# Patient Record
Sex: Female | Born: 1955
Health system: Southern US, Community
[De-identification: ages and names within clinical notes are randomized; demographics above are authoritative.]

## PROBLEM LIST (undated history)

## (undated) DIAGNOSIS — E039 Hypothyroidism, unspecified: Secondary | ICD-10-CM

## (undated) DIAGNOSIS — F32A Depression, unspecified: Secondary | ICD-10-CM

## (undated) DIAGNOSIS — M1990 Unspecified osteoarthritis, unspecified site: Secondary | ICD-10-CM

## (undated) DIAGNOSIS — R112 Nausea with vomiting, unspecified: Secondary | ICD-10-CM

## (undated) DIAGNOSIS — F329 Major depressive disorder, single episode, unspecified: Secondary | ICD-10-CM

## (undated) DIAGNOSIS — Z9889 Other specified postprocedural states: Secondary | ICD-10-CM

## (undated) DIAGNOSIS — T4145XA Adverse effect of unspecified anesthetic, initial encounter: Secondary | ICD-10-CM

## (undated) DIAGNOSIS — T8859XA Other complications of anesthesia, initial encounter: Secondary | ICD-10-CM

## (undated) DIAGNOSIS — H8102 Meniere's disease, left ear: Secondary | ICD-10-CM

## (undated) HISTORY — PX: TONSILLECTOMY: SUR1361

## (undated) HISTORY — PX: ABDOMINAL HYSTERECTOMY: SHX81

## (undated) HISTORY — PX: LASER ABLATION: SHX1947

## (undated) HISTORY — PX: OTHER SURGICAL HISTORY: SHX169

---

## 1997-10-01 ENCOUNTER — Ambulatory Visit (HOSPITAL_COMMUNITY): Admission: RE | Admit: 1997-10-01 | Discharge: 1997-10-01 | Payer: Self-pay | Admitting: Gynecology

## 1998-10-28 ENCOUNTER — Ambulatory Visit (HOSPITAL_COMMUNITY): Admission: RE | Admit: 1998-10-28 | Discharge: 1998-10-28 | Payer: Self-pay | Admitting: Gynecology

## 1999-11-03 ENCOUNTER — Encounter: Payer: Self-pay | Admitting: Gynecology

## 1999-11-03 ENCOUNTER — Ambulatory Visit (HOSPITAL_COMMUNITY): Admission: RE | Admit: 1999-11-03 | Discharge: 1999-11-03 | Payer: Self-pay | Admitting: Gynecology

## 2000-04-24 ENCOUNTER — Other Ambulatory Visit: Admission: RE | Admit: 2000-04-24 | Discharge: 2000-04-24 | Payer: Self-pay | Admitting: Gynecology

## 2000-04-24 ENCOUNTER — Encounter (INDEPENDENT_AMBULATORY_CARE_PROVIDER_SITE_OTHER): Payer: Self-pay | Admitting: Specialist

## 2001-03-20 ENCOUNTER — Encounter: Payer: Self-pay | Admitting: Gynecology

## 2001-03-20 ENCOUNTER — Ambulatory Visit (HOSPITAL_COMMUNITY): Admission: RE | Admit: 2001-03-20 | Discharge: 2001-03-20 | Payer: Self-pay | Admitting: Gynecology

## 2001-06-11 ENCOUNTER — Other Ambulatory Visit: Admission: RE | Admit: 2001-06-11 | Discharge: 2001-06-11 | Payer: Self-pay | Admitting: Gynecology

## 2002-04-16 ENCOUNTER — Encounter: Payer: Self-pay | Admitting: Gynecology

## 2002-04-16 ENCOUNTER — Ambulatory Visit (HOSPITAL_COMMUNITY): Admission: RE | Admit: 2002-04-16 | Discharge: 2002-04-16 | Payer: Self-pay | Admitting: Gynecology

## 2002-07-09 ENCOUNTER — Other Ambulatory Visit: Admission: RE | Admit: 2002-07-09 | Discharge: 2002-07-09 | Payer: Self-pay | Admitting: Gynecology

## 2002-07-17 ENCOUNTER — Ambulatory Visit (HOSPITAL_COMMUNITY): Admission: RE | Admit: 2002-07-17 | Discharge: 2002-07-17 | Payer: Self-pay | Admitting: Family Medicine

## 2002-07-17 ENCOUNTER — Encounter: Payer: Self-pay | Admitting: Family Medicine

## 2003-06-03 ENCOUNTER — Ambulatory Visit (HOSPITAL_COMMUNITY): Admission: RE | Admit: 2003-06-03 | Discharge: 2003-06-03 | Payer: Self-pay | Admitting: Gynecology

## 2003-07-08 ENCOUNTER — Other Ambulatory Visit: Admission: RE | Admit: 2003-07-08 | Discharge: 2003-07-08 | Payer: Self-pay | Admitting: Gynecology

## 2004-05-31 ENCOUNTER — Ambulatory Visit (HOSPITAL_COMMUNITY): Admission: RE | Admit: 2004-05-31 | Discharge: 2004-05-31 | Payer: Self-pay | Admitting: Family Medicine

## 2004-06-14 ENCOUNTER — Ambulatory Visit (HOSPITAL_COMMUNITY): Admission: RE | Admit: 2004-06-14 | Discharge: 2004-06-14 | Payer: Self-pay | Admitting: Neurosurgery

## 2004-08-10 ENCOUNTER — Ambulatory Visit (HOSPITAL_COMMUNITY): Admission: RE | Admit: 2004-08-10 | Discharge: 2004-08-10 | Payer: Self-pay | Admitting: Gynecology

## 2004-08-11 ENCOUNTER — Other Ambulatory Visit: Admission: RE | Admit: 2004-08-11 | Discharge: 2004-08-11 | Payer: Self-pay | Admitting: Gynecology

## 2005-09-04 ENCOUNTER — Other Ambulatory Visit: Admission: RE | Admit: 2005-09-04 | Discharge: 2005-09-04 | Payer: Self-pay | Admitting: Gynecology

## 2005-10-18 ENCOUNTER — Ambulatory Visit (HOSPITAL_COMMUNITY): Admission: RE | Admit: 2005-10-18 | Discharge: 2005-10-18 | Payer: Self-pay | Admitting: Gynecology

## 2006-06-25 ENCOUNTER — Ambulatory Visit (HOSPITAL_COMMUNITY): Admission: RE | Admit: 2006-06-25 | Discharge: 2006-06-26 | Payer: Self-pay | Admitting: Obstetrics and Gynecology

## 2006-06-25 ENCOUNTER — Encounter (INDEPENDENT_AMBULATORY_CARE_PROVIDER_SITE_OTHER): Payer: Self-pay | Admitting: Specialist

## 2006-09-11 ENCOUNTER — Other Ambulatory Visit: Admission: RE | Admit: 2006-09-11 | Discharge: 2006-09-11 | Payer: Self-pay | Admitting: Gynecology

## 2006-11-01 ENCOUNTER — Ambulatory Visit (HOSPITAL_COMMUNITY): Admission: RE | Admit: 2006-11-01 | Discharge: 2006-11-01 | Payer: Self-pay | Admitting: Gynecology

## 2006-11-21 ENCOUNTER — Encounter (INDEPENDENT_AMBULATORY_CARE_PROVIDER_SITE_OTHER): Payer: Self-pay | Admitting: *Deleted

## 2006-11-21 ENCOUNTER — Ambulatory Visit (HOSPITAL_COMMUNITY): Admission: RE | Admit: 2006-11-21 | Discharge: 2006-11-21 | Payer: Self-pay | Admitting: Obstetrics and Gynecology

## 2007-10-08 ENCOUNTER — Other Ambulatory Visit: Admission: RE | Admit: 2007-10-08 | Discharge: 2007-10-08 | Payer: Self-pay | Admitting: Gynecology

## 2007-11-29 ENCOUNTER — Ambulatory Visit (HOSPITAL_COMMUNITY): Admission: RE | Admit: 2007-11-29 | Discharge: 2007-11-29 | Payer: Self-pay | Admitting: Gynecology

## 2009-07-08 ENCOUNTER — Ambulatory Visit (HOSPITAL_COMMUNITY): Admission: RE | Admit: 2009-07-08 | Discharge: 2009-07-09 | Payer: Self-pay | Admitting: Neurosurgery

## 2009-10-16 ENCOUNTER — Encounter: Admission: RE | Admit: 2009-10-16 | Discharge: 2009-10-16 | Payer: Self-pay | Admitting: Otolaryngology

## 2010-09-27 ENCOUNTER — Other Ambulatory Visit: Payer: Self-pay | Admitting: Dermatology

## 2010-11-01 LAB — CBC
MCHC: 34.4 g/dL (ref 30.0–36.0)
MCV: 94.7 fL (ref 78.0–100.0)
Platelets: 200 10*3/uL (ref 150–400)

## 2010-11-01 LAB — BASIC METABOLIC PANEL
BUN: 11 mg/dL (ref 6–23)
CO2: 28 mEq/L (ref 19–32)
Chloride: 104 mEq/L (ref 96–112)
Creatinine, Ser: 0.91 mg/dL (ref 0.4–1.2)
Glucose, Bld: 114 mg/dL — ABNORMAL HIGH (ref 70–99)

## 2010-11-01 LAB — PROTIME-INR: Prothrombin Time: 13.4 seconds (ref 11.6–15.2)

## 2010-12-16 NOTE — Op Note (Signed)
NAME:  KIANDRA, SANGUINETTI              ACCOUNT NO.:  [id number]   MEDICAL RECORD NO.:  [id number]          PATIENT TYPE:  AMB   LOCATION:  SDC                           FACILITY:  [facility]   PHYSICIAN:  Michelle L. Grewal, M.D.DATE OF BIRTH:  February 17, 1956   DATE OF PROCEDURE:  11/21/2006  DATE OF DISCHARGE:                               OPERATIVE REPORT   PREOPERATIVE DIAGNOSIS:  1. Status post laparoscopic-assisted vaginal hysterectomy.  2. Dyspareunia.  3. Rule out prolapsed fallopian tube through the vaginal cuff.   POSTOP DIAGNOSIS:  1. Status post laparoscopic-assisted vaginal hysterectomy.  2. Dyspareunia.  3. Rule out prolapsed fallopian tube through the vaginal cuff.  4. Endometriosis involving the left pelvic side wall and granulation      tissue at vaginal cuff.   PROCEDURES:  1. Diagnostic laparoscopy.  2. Lysis of adhesions.  3. Fulguration of endometriosis on left pelvic side wall.  4. Removal of vaginal cuff granulation tissue.   SURGEON:  Michelle L. Vincente Poli, M.D.   ASSISTANT:  None.   ANESTHESIA:  General.   SPECIMENS:  Vaginal cuff with granulation tissue.   ESTIMATED BLOOD LOSS:  Minimal.   COMPLICATIONS:  None.   DESCRIPTION OF PROCEDURE:  The patient was taken to the operating room.  She was intubated.  She was then prepped and draped and in-and-out  catheter was used to empty the bladder.  I then inserted a speculum in  the vagina and inspected the vagina.  The vagina had excellent support.  The vaginal cuff was noted to have a small nickel-sized area in the  center of the cuff that appeared to be either granulation tissue or  could be a prolapsed fimbriated end of the fallopian tube.  It appeared  unchanged from exam in the office.  At this point, we placed a sponge  stick in the vagina.  We then turned our attention to the abdomen where  local was infiltrated at the umbilicus and suprapubically and then an  infraumbilical incision was made.  Veress needle  was inserted.  Pneumoperitoneum was performed.  The Veress needle was removed.  The 11-  mm trocar was inserted.  The patient was then placed in Trendelenburg  position.  The laparoscope was then inserted through the trocar sheath.  A 5 mm port was placed suprapubically under direct visualization.  We  then examined the pelvis.  She was status post hysterectomy, of course.  The right tube and ovary were high in the pelvis and actually were  nowhere near the pelvis.  There were no adhesions involving the ovary  and tubes.  They appeared very normal.  There was an adhesion involving  the mesentery of the large bowel to the round ligament on the right.  I  did take that down with scissors with excellent hemostasis.  We then  inspected the vaginal cuff.  The vaginal cuff showed a small dimpled  area which appeared to be a peritoneal defect which may have been a  small separation of the vaginal cuff at some point after her surgery.  This is consistent with the exam in  the vagina, and so the area in the  vagina must simply be granulation tissue.  I did inspect the left tube  and ovary.  The left tube and ovary were normal in appearance.  There  were against the side wall.  The left tube and ovary appeared normal and  were mobile and there was one small little powder burn lesion down on  the left side wall that I burned that was consistent with endometriosis.  Everything looked normal.  I did not feel that she needed to have her  ovaries and tubes removed.  At this point, I went ahead and after  fulgurating the endometriosis and taking down the adhesions, I did place  a small amount of Intercede down in the vaginal cuff.  At this point, I  then released the pneumoperitoneum and turned my attention to the  vagina.  I put the speculum in the vagina and I grasped the granulation  tissue with Allis clamps.  Some of it just came off simply with just  grasping with Allis clamps, but I did take the  Metzenbaum scissors and  cut out the base of that.  There was no large separation of the cuff per  se when I did this, but then I did close the area over three figure-of-  eights using 0-Vicryl suture.  Hemostasis was excellent.  Estrogen cream  was placed in the vagina and the vaginal cuff which I hoped would  promote excellent wound healing.  At this point, I then went back up to  the laparoscope, replaced the pneumoperitoneum, reinspected everything.  Everything was hemostatic.  I then looked through the laparoscope and  looked at the vaginal cuff, and to me it looked no different.  I did  make sure that the Interceed was over the small area of dimpling, and I  am hopeful that this will actually help prevent any further problem with  development in granulation tissue.  At this point, all sponge, lap and  instrument counts were correct x2.  The trocars were removed after  pneumoperitoneum was released.  The incisions were closed with 2-0  Vicryl suture and then skin was reapproximated using Dermabond skin  adhesive.  All sponge, lap and instrument counts were correct x2.  The  patient went to recovery room in stable condition.      Michelle L. Vincente Poli, M.D.  Electronically Signed     MLG/MEDQ  D:  11/21/2006  T:  11/21/2006  Job:  [id number]

## 2010-12-16 NOTE — Op Note (Signed)
NAME:  Sara Santiago, Sara Santiago              ACCOUNT NO.:  [id number]   MEDICAL RECORD NO.:  [id number]          PATIENT TYPE:  AMB   LOCATION:  SDC                           FACILITY:  [facility]   PHYSICIAN:  Michelle L. Grewal, M.D.DATE OF BIRTH:  Apr 13, 1956   DATE OF PROCEDURE:  06/25/2006  DATE OF DISCHARGE:                               OPERATIVE REPORT   PREOPERATIVE DIAGNOSIS:  Menometrorrhagia.   POSTOPERATIVE DIAGNOSES:  1. Menometrorrhagia.  2. Possible adenomyosis.   PROCEDURE:  Laparoscopic-assisted vaginal hysterectomy.   SURGEON:  Michelle L. Vincente Poli, M.D.   ANESTHESIA:  General.   SPECIMENS:  Uterus and cervix.   ESTIMATED BLOOD LOSS:  300 cc.   COMPLICATIONS:  None.   PROCEDURE:  The patient was taken to the operating room.  She was  intubated without difficulty.  She was prepped and draped in the usual  sterile fashion.  A Foley catheter was inserted and draining clear  urine.  A uterine manipulator was inserted.  Our attention was turned to  the abdomen, where a small infraumbilical incision was made, and the  Veress needle was inserted, and pneumoperitoneum was performed.  The 11  mm trocar was inserted.  The patient was gently placed in Trendelenburg  position.  A 5 mm trocar was placed suprapubically under direct  visualization.  Exam of the abdomen and pelvis revealed the ovaries  appeared to be normal. The fallopian tubes were normal.  There was no  evidence of endometriosis or adhesions.  The uterus was globular in  shape and grossly consistent with adenomyosis, but of course pathology  will determine if this was the etiology of her bleeding.  We then  grasped the triple pedicle initially on the right and then on the left  and, using the Gyrus instrument, with excellent hemostasis, I burned it  and carried it down to the round ligament on each side. After we did  this, there was absolutely no bleeding in the pelvis.  We then turned  our attention to the vagina.  All instruments except for the trocar  sheaths were removed, and the pneumoperitoneum was released during this  time. We placed a weighted speculum in the vagina, and the cervix was  grasped with tenaculum.  A circumferential incision was made around the  cervix.  The posterior cul-de-sac was entered sharply without difficulty  and Mayo scissors.  The anterior cul-de-sac was entered sharply as well.  We then used curved Heaney clamps and clamped the uterosacral cardinal  ligaments on either side.  Each pedicle was suture ligated using 0  Vicryl suture.  I walked my way up along the broad ligament, staying  just beside the uterus. Each pedicle was suture ligated using 0 Vicryl  suture.  The uterus was retroflexed, and the remainder of the broad  ligaments clamped on either side.  The specimen was removed.  The  pedicles was secured using a suture ligature of 0 Vicryl suture.  The  posterior cuff was closed in continuous running-locked fashion using 0  Vicryl suture, and then the cuff was closed anterior to posterior in a  continuous running-locked stitch using 0 Vicryl suture.  At this point,  attention was turned back to the abdomen.  I changed my gloves and  replaced the pneumoperitoneum. The patient was again placed in  Trendelenburg position, and the pelvis was irrigated. Hemostasis was  excellent.  We did place a small piece of Interceed down on the vaginal  cuff for adhesion prevention.  There was no bleeding whatsoever. The  pneumoperitoneum was released. The trocars were removed. The incisions  were closed using Dermabond skin adhesive. All sponge, lap, and  instrument counts were correct x2.  The patient tolerated the procedure  very well and went to the recovery room in stable condition.   PATHOLOGY:  Uterus and cervix.      Michelle L. Vincente Poli, M.D.  Electronically Signed     MLG/MEDQ  D:  06/25/2006  T:  06/25/2006  Job:  [id number]

## 2011-11-27 ENCOUNTER — Ambulatory Visit: Payer: BC Managed Care – PPO | Admitting: Physician Assistant

## 2011-11-27 VITALS — BP 121/75 | HR 78 | Temp 98.6°F | Resp 16 | Ht 67.5 in | Wt 193.6 lb

## 2011-11-27 DIAGNOSIS — L255 Unspecified contact dermatitis due to plants, except food: Secondary | ICD-10-CM

## 2011-11-27 DIAGNOSIS — L0390 Cellulitis, unspecified: Secondary | ICD-10-CM

## 2011-11-27 DIAGNOSIS — L0291 Cutaneous abscess, unspecified: Secondary | ICD-10-CM

## 2011-11-27 MED ORDER — PREDNISONE 20 MG PO TABS: ORAL_TABLET | ORAL | Status: AC

## 2011-11-27 MED ORDER — DOXYCYCLINE HYCLATE 100 MG PO CAPS: 100.0000 mg | ORAL_CAPSULE | Freq: Two times a day (BID) | ORAL | Status: AC

## 2011-11-27 NOTE — Progress Notes (Signed)
Patient ID: RUDOLPH DOBLER MRN: [id number], DOB: 01/05/1956, 56 y.o. Date of Encounter: 11/27/2011, 8:08 PM  Primary Physician: Allean Found, MD, MD  Chief Complaint: Pruritic rash  HPI: 56 y.o. year old female with presents with 3-4 day history of mildly erythematous pruritic rash along bilateral forearms. Rash has now spread to lower neck and some along the abdomen. Patient was doing yard work prior [place] the [place] of the [place] Poison ivy in the vicinity. She feels like her dog got into the ivy. This was followed by her cradling the dog in her forearms, causing the distribution of her rash. While she was pulling some of the weeds she was scratched by a branch along her medial right forearm. She states there may be a splinter still present and would like for [place] to evaluate this tonight. Lesions now weeping clear fluid. Has tried OTC lotions without success. Patient is otherwise doing well without issues or complaints.  No past medical history on file.   Home Meds: Prior to Admission medications   Medication Sig Start Date End Date Taking? Authorizing Provider  levothyroxine (SYNTHROID, LEVOTHROID) 137 MCG tablet Take 137 mcg by mouth daily.   Yes Historical Provider, MD  sertraline (ZOLOFT) 100 MG tablet Take 100 mg by mouth daily.   Yes Historical Provider, MD  simvastatin (ZOCOR) 10 MG tablet Take 10 mg by mouth at bedtime.   Yes Historical Provider, MD  triamterene-hydrochlorothiazide (DYAZIDE) 37.5-25 MG per capsule Take 1 capsule by mouth every morning.   Yes Historical Provider, MD                  Allergies: No Known Allergies  History   Social History  . Marital Status: Married    Spouse Name: N/A    Number of Children: N/A  . Years of Education: N/A   Occupational History  . Not on file.   Social History Main Topics  . Smoking status: Never Smoker   . Smokeless tobacco: Not on file  . Alcohol Use: Not on file  . Drug  Use: Not on file  . Sexually Active: Not on file   Other Topics Concern  . Not on file   Social History Narrative  . No narrative on file     Review of Systems: Constitutional: negative for chills, fever, night sweats, weight changes, or fatigue  HEENT: negative for vision changes, or hearing loss  Cardiovascular: negative for chest pain or palpitations Respiratory: negative for hemoptysis, wheezing, shortness of breath, or cough Dermatological: see above    Physical Exam: Blood pressure 121/75, pulse 78, temperature 98.6 F (37 C), temperature source Oral, resp. rate 16, height 5' 7.5" (1.715 m), weight 193 lb 9.6 oz (87.816 kg)., Body mass index is 29.87 kg/(m^2). General: Well developed, well nourished, in no acute distress. Head: Normocephalic, atraumatic, eyes without discharge, sclera non-icteric, nares are without discharge.   Neck: Supple. FROM. Lungs: Breathing is unlabored. Heart: Regular rate. Msk:  Strength and tone normal for age. Extremities/Skin: Warm and dry. Multiple vesicular weeping lesions along bilateral arms consistent with poison ivy. Mild secondary infection along right medial forearm with possible superficial FB vs scab. No clubbing or cyanosis. No edema.  Neuro: Alert and oriented X 3. Moves all extremities spontaneously. Gait is normal. CNII-XII grossly in tact. Psych:  Responds to questions appropriately with a normal affect.   VCO. Betadine prep per usual protocol. Scab unroofed revealing possible FB/dirt vs dried blood  which was removed with splinter forceps. Area thoroughly washed with soap and water. Wound bed healthy without any further FB fragments visualized. Dressed. Patient tolerated procedure well.  ASSESSMENT AND PLAN:  56 y.o. year old female with poison ivy and mild cellulitis -Prednisone 20 mg #18 3x3, 2x3, 1x3 no RF SED -Doxycycline 100 mg #20 1 po bid no RF -Zyrtec -Zantac -Benadryl -Wash all contaminated clothes and linens -RTC 10  days if symptoms persist, sooner if they worsen   Signed, Eula Listen, PA-C 11/27/2011 8:08 PM

## 2014-09-22 ENCOUNTER — Other Ambulatory Visit: Payer: Self-pay | Admitting: Gynecology

## 2014-09-23 LAB — CYTOLOGY - PAP

## 2014-10-22 ENCOUNTER — Other Ambulatory Visit: Payer: Self-pay | Admitting: Dermatology

## 2015-03-08 ENCOUNTER — Other Ambulatory Visit (HOSPITAL_COMMUNITY): Payer: Self-pay | Admitting: Orthopaedic Surgery

## 2015-03-25 NOTE — Patient Instructions (Signed)
Sara Santiago  03/25/2015   Your procedure is scheduled on: Friday 04/02/2015  Report to [facility]  Entrance take [place]  elevators to 3rd floor to  Short Stay Center at  0750 AM.  Call this number if you have problems the morning of surgery [phone]   Remember: ONLY 1 PERSON MAY GO WITH YOU TO SHORT STAY TO GET  READY MORNING OF YOUR SURGERY.   Do not eat food or drink liquids :After Midnight.     Take these medicines the morning of surgery with A SIP OF WATER: Tegretol, Levothyroxine                               You may not have any metal on your body including hair pins and              piercings  Do not wear jewelry, make-up, lotions, powders or perfumes, deodorant             Do not wear nail polish.  Do not shave  48 hours prior to surgery.              Men may shave face and neck.   Do not bring valuables to the hospital. [facility] IS NOT             RESPONSIBLE   FOR VALUABLES.  Contacts, dentures or bridgework may not be worn into surgery.  Leave suitcase in the car. After surgery it may be brought to your room.     Patients discharged the day of surgery will not be allowed to drive home.  Name and phone number of your driver:  Special Instructions: N/A              Please read over the following fact sheets you were given: _____________________________________________________________________             [facility] - Preparing for Surgery Before surgery, you can play an important role.  Because skin is not sterile, your skin needs to be as free of germs as possible.  You can reduce the number of germs on your skin by washing with CHG (chlorahexidine gluconate) soap before surgery.  CHG is an antiseptic cleaner which kills germs and bonds with the skin to continue killing germs even after washing. Please DO NOT use if you have an allergy to CHG or antibacterial soaps.  If your skin becomes reddened/irritated stop using the CHG and  inform your nurse when you arrive at Short Stay. Do not shave (including legs and underarms) for at least 48 hours prior to the first CHG shower.  You may shave your face/neck. Please follow these instructions carefully:  1.  Shower with CHG Soap the night before surgery and the  morning of Surgery.  2.  If you choose to wash your hair, wash your hair first as usual with your  normal  shampoo.  3.  After you shampoo, rinse your hair and body thoroughly to remove the  shampoo.                           4.  Use CHG as you would any other liquid soap.  You can apply chg directly  to the skin and wash  Gently with a scrungie or clean washcloth.  5.  Apply the CHG Soap to your body ONLY FROM THE NECK DOWN.   Do not use on face/ open                           Wound or open sores. Avoid contact with eyes, ears mouth and genitals (private parts).                       Wash face,  Genitals (private parts) with your normal soap.             6.  Wash thoroughly, paying special attention to the area where your surgery  will be performed.  7.  Thoroughly rinse your body with warm water from the neck down.  8.  DO NOT shower/wash with your normal soap after using and rinsing off  the CHG Soap.                9.  Pat yourself dry with a clean towel.            10.  Wear clean pajamas.            11.  Place clean sheets on your bed the night of your first shower and do not  sleep with pets. Day of Surgery : Do not apply any lotions/deodorants the morning of surgery.  Please wear clean clothes to the hospital/surgery center.  FAILURE TO FOLLOW THESE INSTRUCTIONS MAY RESULT IN THE CANCELLATION OF YOUR SURGERY PATIENT SIGNATURE_________________________________  NURSE SIGNATURE__________________________________  ________________________________________________________________________   Sara Santiago  An incentive spirometer is a tool that can help keep your lungs clear and  active. This tool measures how well you are filling your lungs with each breath. Taking long deep breaths may help reverse or decrease the chance of developing breathing (pulmonary) problems (especially infection) following:  A long period of time when you are unable to move or be active. BEFORE THE PROCEDURE   If the spirometer includes an indicator to show your best effort, your nurse or respiratory therapist will set it to a desired goal.  If possible, sit up straight or lean slightly forward. Try not to slouch.  Hold the incentive spirometer in an upright position. INSTRUCTIONS FOR USE   Sit on the edge of your bed if possible, or sit up as far as you can in bed or on a chair.  Hold the incentive spirometer in an upright position.  Breathe out normally.  Place the mouthpiece in your mouth and seal your lips tightly around it.  Breathe in slowly and as deeply as possible, raising the piston or the ball toward the top of the column.  Hold your breath for 3-5 seconds or for as long as possible. Allow the piston or ball to fall to the bottom of the column.  Remove the mouthpiece from your mouth and breathe out normally.  Rest for a few seconds and repeat Steps 1 through 7 at least 10 times every 1-2 hours when you are awake. Take your time and take a few normal breaths between deep breaths.  The spirometer may include an indicator to show your best effort. Use the indicator as a goal to work toward during each repetition.  After each set of 10 deep breaths, practice coughing to be sure your lungs are clear. If you have an incision (the cut made at the time of surgery),  support your incision when coughing by placing a pillow or rolled up towels firmly against it. Once you are able to get out of bed, walk around indoors and cough well. You may stop using the incentive spirometer when instructed by your caregiver.  RISKS AND COMPLICATIONS  Take your time so you do not get dizzy or  light-headed.  If you are in pain, you may need to take or ask for pain medication before doing incentive spirometry. It is harder to take a deep breath if you are having pain. AFTER USE  Rest and breathe slowly and easily.  It can be helpful to keep track of a log of your progress. Your caregiver can provide you with a simple table to help with this. If you are using the spirometer at home, follow these instructions: [place] IF:   You are having difficultly using the spirometer.  You have trouble using the spirometer as often as instructed.  Your pain medication is not giving enough relief while using the spirometer.  You develop fever of 100.5 F (38.1 C) or higher. SEEK IMMEDIATE MEDICAL CARE IF:   You cough up bloody sputum that had not been present before.  You develop fever of 102 F (38.9 C) or greater.  You develop worsening pain at or near the incision site. MAKE SURE YOU:   Understand these instructions.  Will watch your condition.  Will get help right away if you are not doing well or get worse. Document Released: 11/27/2006 Document Revised: 10/09/2011 Document Reviewed: 01/28/2007 [employer] Patient Information 2014 [employer], [place].   ________________________________________________________________________  WHAT IS A BLOOD TRANSFUSION? Blood Transfusion Information  A transfusion is the replacement of blood or some of its parts. Blood is made up of multiple cells which provide different functions.  Red blood cells carry oxygen and are used for blood loss replacement.  White blood cells fight against infection.  Platelets control bleeding.  Plasma helps clot blood.  Other blood products are available for specialized needs, such as hemophilia or other clotting disorders. BEFORE THE TRANSFUSION  Who gives blood for transfusions?   Healthy volunteers who are fully evaluated to make sure their blood is safe. This is blood bank  blood. Transfusion therapy is the safest it has ever been in the practice of medicine. Before blood is taken from a donor, a complete history is taken to make sure that person has no history of diseases nor engages in risky social behavior (examples are intravenous drug use or sexual activity with multiple partners). The donor's travel history is screened to minimize risk of transmitting infections, such as malaria. The donated blood is tested for signs of infectious diseases, such as HIV and hepatitis. The blood is then tested to be sure it is compatible with you in order to minimize the chance of a transfusion reaction. If you or a relative donates blood, this is often done in anticipation of surgery and is not appropriate for emergency situations. It takes many days to process the donated blood. RISKS AND COMPLICATIONS Although transfusion therapy is very safe and saves many lives, the main dangers of transfusion include:   Getting an infectious disease.  Developing a transfusion reaction. This is an allergic reaction to something in the blood you were given. Every precaution is taken to prevent this. The decision to have a blood transfusion has been considered carefully by your caregiver before blood is given. Blood is not given unless the benefits outweigh the risks. AFTER THE TRANSFUSION  Right after receiving a blood transfusion, you will usually feel much better and more energetic. This is especially true if your red blood cells have gotten low (anemic). The transfusion raises the level of the red blood cells which carry oxygen, and this usually causes an energy increase.  The nurse administering the transfusion will monitor you carefully for complications. HOME CARE INSTRUCTIONS  No special instructions are needed after a transfusion. You may find your energy is better. Speak with your caregiver about any limitations on activity for underlying diseases you may have. SEEK MEDICAL CARE IF:    Your condition is not improving after your transfusion.  You develop redness or irritation at the intravenous (IV) site. SEEK IMMEDIATE MEDICAL CARE IF:  Any of the following symptoms occur over the next 12 hours:  Shaking chills.  You have a temperature by mouth above 102 F (38.9 C), not controlled by medicine.  Chest, back, or muscle pain.  People around you feel you are not acting correctly or are confused.  Shortness of breath or difficulty breathing.  Dizziness and fainting.  You get a rash or develop hives.  You have a decrease in urine output.  Your urine turns a dark color or changes to pink, red, or brown. Any of the following symptoms occur over the next 10 days:  You have a temperature by mouth above 102 F (38.9 C), not controlled by medicine.  Shortness of breath.  Weakness after normal activity.  The white part of the eye turns yellow (jaundice).  You have a decrease in the amount of urine or are urinating less often.  Your urine turns a dark color or changes to pink, red, or brown. Document Released: 07/14/2000 Document Revised: 10/09/2011 Document Reviewed: 03/02/2008 [facility] Patient Information 2014 [place], [place].  _______________________________________________________________________

## 2015-03-26 ENCOUNTER — Other Ambulatory Visit (HOSPITAL_COMMUNITY): Payer: Self-pay | Admitting: *Deleted

## 2015-03-26 ENCOUNTER — Encounter (HOSPITAL_COMMUNITY): Payer: Self-pay

## 2015-03-26 ENCOUNTER — Encounter (HOSPITAL_COMMUNITY)
Admission: RE | Admit: 2015-03-26 | Discharge: 2015-03-26 | Disposition: A | Discharge status: Home / Self Care | Payer: BLUE CROSS/BLUE SHIELD | Source: Ambulatory Visit | Attending: Orthopaedic Surgery | Admitting: Orthopaedic Surgery

## 2015-03-26 DIAGNOSIS — M1611 Unilateral primary osteoarthritis, right hip: Secondary | ICD-10-CM | POA: Diagnosis not present

## 2015-03-26 DIAGNOSIS — Z01818 Encounter for other preprocedural examination: Secondary | ICD-10-CM | POA: Diagnosis not present

## 2015-03-26 HISTORY — DX: Meniere's disease, left ear: H81.02

## 2015-03-26 HISTORY — DX: Adverse effect of unspecified anesthetic, initial encounter: T41.45XA

## 2015-03-26 HISTORY — DX: Nausea with vomiting, unspecified: R11.2

## 2015-03-26 HISTORY — DX: Depression, unspecified: F32.A

## 2015-03-26 HISTORY — DX: Unspecified osteoarthritis, unspecified site: M19.90

## 2015-03-26 HISTORY — DX: Nausea with vomiting, unspecified: Z98.890

## 2015-03-26 HISTORY — DX: Hypothyroidism, unspecified: E03.9

## 2015-03-26 HISTORY — DX: Major depressive disorder, single episode, unspecified: F32.9

## 2015-03-26 HISTORY — DX: Other complications of anesthesia, initial encounter: T88.59XA

## 2015-03-26 LAB — BASIC METABOLIC PANEL
Anion gap: 8 (ref 5–15)
BUN: 12 mg/dL (ref 6–20)
CHLORIDE: 102 mmol/L (ref 101–111)
CO2: 28 mmol/L (ref 22–32)
Calcium: 9.2 mg/dL (ref 8.9–10.3)
Creatinine, Ser: 0.87 mg/dL (ref 0.44–1.00)
GFR calc non Af Amer: >60 mL/min (ref >60)
Glucose, Bld: 97 mg/dL (ref 65–99)
POTASSIUM: 4.3 mmol/L (ref 3.5–5.1)
SODIUM: 138 mmol/L (ref 135–145)

## 2015-03-26 LAB — CBC
HEMATOCRIT: 35.4 % — AB (ref 36.0–46.0)
HEMOGLOBIN: 11.5 g/dL — AB (ref 12.0–15.0)
MCH: 31.9 pg (ref 26.0–34.0)
MCHC: 32.5 g/dL (ref 30.0–36.0)
MCV: 98.1 fL (ref 78.0–100.0)
Platelets: 251 10*3/uL (ref 150–400)
RBC: 3.61 MIL/uL — AB (ref 3.87–5.11)
RDW: 13.1 % (ref 11.5–15.5)
WBC: 4.9 10*3/uL (ref 4.0–10.5)

## 2015-03-26 LAB — PROTIME-INR
INR: 1.02 (ref 0.00–1.49)
Prothrombin Time: 13.6 seconds (ref 11.6–15.2)

## 2015-03-26 LAB — APTT: aPTT: 40 seconds — ABNORMAL HIGH (ref 24–37)

## 2015-03-26 LAB — SURGICAL PCR SCREEN
MRSA, PCR: NEGATIVE
STAPHYLOCOCCUS AUREUS: NEGATIVE

## 2015-03-26 LAB — ABO/RH: ABO/RH(D): O POS

## 2015-04-02 ENCOUNTER — Encounter (HOSPITAL_COMMUNITY): Payer: Self-pay | Admitting: *Deleted

## 2015-04-02 ENCOUNTER — Inpatient Hospital Stay (HOSPITAL_COMMUNITY): Payer: BLUE CROSS/BLUE SHIELD

## 2015-04-02 ENCOUNTER — Encounter (HOSPITAL_COMMUNITY)
Admission: RE | Disposition: A | Discharge status: Home Health | Payer: Self-pay | Source: Ambulatory Visit | Attending: Orthopaedic Surgery

## 2015-04-02 ENCOUNTER — Inpatient Hospital Stay (HOSPITAL_COMMUNITY): Payer: BLUE CROSS/BLUE SHIELD | Admitting: Certified Registered Nurse Anesthetist

## 2015-04-02 ENCOUNTER — Inpatient Hospital Stay (HOSPITAL_COMMUNITY)
Admission: RE | Admit: 2015-04-02 | Discharge: 2015-04-05 | DRG: 470 | Disposition: A | Discharge status: Home Health | Payer: BLUE CROSS/BLUE SHIELD | Source: Ambulatory Visit | Attending: Orthopaedic Surgery | Admitting: Orthopaedic Surgery

## 2015-04-02 DIAGNOSIS — D62 Acute posthemorrhagic anemia: Secondary | ICD-10-CM | POA: Diagnosis not present

## 2015-04-02 DIAGNOSIS — H8102 Meniere's disease, left ear: Secondary | ICD-10-CM | POA: Diagnosis present

## 2015-04-02 DIAGNOSIS — E039 Hypothyroidism, unspecified: Secondary | ICD-10-CM | POA: Diagnosis present

## 2015-04-02 DIAGNOSIS — M25451 Effusion, right hip: Secondary | ICD-10-CM | POA: Diagnosis present

## 2015-04-02 DIAGNOSIS — M1611 Unilateral primary osteoarthritis, right hip: Principal | ICD-10-CM

## 2015-04-02 DIAGNOSIS — Z01812 Encounter for preprocedural laboratory examination: Secondary | ICD-10-CM | POA: Diagnosis not present

## 2015-04-02 DIAGNOSIS — R52 Pain, unspecified: Secondary | ICD-10-CM

## 2015-04-02 DIAGNOSIS — M25851 Other specified joint disorders, right hip: Secondary | ICD-10-CM | POA: Diagnosis present

## 2015-04-02 DIAGNOSIS — F329 Major depressive disorder, single episode, unspecified: Secondary | ICD-10-CM | POA: Diagnosis present

## 2015-04-02 DIAGNOSIS — Z23 Encounter for immunization: Secondary | ICD-10-CM | POA: Diagnosis not present

## 2015-04-02 DIAGNOSIS — M25551 Pain in right hip: Secondary | ICD-10-CM | POA: Diagnosis present

## 2015-04-02 DIAGNOSIS — Z96641 Presence of right artificial hip joint: Secondary | ICD-10-CM

## 2015-04-02 HISTORY — PX: TOTAL HIP ARTHROPLASTY: SHX124

## 2015-04-02 LAB — TYPE AND SCREEN
ABO/RH(D): O POS
Antibody Screen: NEGATIVE

## 2015-04-02 SURGERY — ARTHROPLASTY, HIP, TOTAL, ANTERIOR APPROACH
Anesthesia: Spinal | Site: Hip | Laterality: Right

## 2015-04-02 MED ORDER — MIDAZOLAM HCL 5 MG/5ML IJ SOLN
INTRAMUSCULAR | Status: DC | PRN
  Administered 2015-04-02: 2 mg via INTRAVENOUS

## 2015-04-02 MED ORDER — LEVOTHYROXINE SODIUM 175 MCG PO TABS
175.0000 ug | ORAL_TABLET | Freq: Every day | ORAL | Status: DC
  Administered 2015-04-03 – 2015-04-05 (×3): 175 ug via ORAL
  Filled 2015-04-02 (×4): qty 1

## 2015-04-02 MED ORDER — ZOLPIDEM TARTRATE 5 MG PO TABS
5.0000 mg | ORAL_TABLET | Freq: Every evening | ORAL | Status: DC | PRN
  Administered 2015-04-03 – 2015-04-04 (×2): 5 mg via ORAL
  Filled 2015-04-02 (×2): qty 1

## 2015-04-02 MED ORDER — ONDANSETRON HCL 4 MG/2ML IJ SOLN
INTRAMUSCULAR | Status: AC
  Filled 2015-04-02: qty 2

## 2015-04-02 MED ORDER — CEFAZOLIN SODIUM 1-5 GM-% IV SOLN
1.0000 g | Freq: Four times a day (QID) | INTRAVENOUS | Status: AC
  Administered 2015-04-02 (×2): 1 g via INTRAVENOUS
  Filled 2015-04-02 (×2): qty 50

## 2015-04-02 MED ORDER — PROPOFOL 10 MG/ML IV BOLUS
INTRAVENOUS | Status: DC | PRN
  Administered 2015-04-02 (×3): 20 mg via INTRAVENOUS
  Administered 2015-04-02: 10 mg via INTRAVENOUS
  Administered 2015-04-02: 20 mg via INTRAVENOUS
  Administered 2015-04-02: 10 mg via INTRAVENOUS

## 2015-04-02 MED ORDER — CARBAMAZEPINE 200 MG PO TABS
200.0000 mg | ORAL_TABLET | Freq: Two times a day (BID) | ORAL | Status: DC
  Administered 2015-04-02 – 2015-04-05 (×5): 200 mg via ORAL
  Filled 2015-04-02 (×7): qty 1

## 2015-04-02 MED ORDER — OXYCODONE HCL 5 MG PO TABS
5.0000 mg | ORAL_TABLET | ORAL | Status: DC | PRN
  Administered 2015-04-02: 10 mg via ORAL
  Administered 2015-04-02 (×2): 5 mg via ORAL
  Administered 2015-04-03 (×3): 10 mg via ORAL
  Filled 2015-04-02 (×3): qty 2
  Filled 2015-04-02: qty 1
  Filled 2015-04-02 (×2): qty 2
  Filled 2015-04-02: qty 1

## 2015-04-02 MED ORDER — SERTRALINE HCL 100 MG PO TABS
100.0000 mg | ORAL_TABLET | Freq: Every day | ORAL | Status: DC
  Administered 2015-04-02 – 2015-04-04 (×3): 100 mg via ORAL
  Filled 2015-04-02 (×4): qty 1

## 2015-04-02 MED ORDER — HYDROMORPHONE HCL 1 MG/ML IJ SOLN
0.2500 mg | INTRAMUSCULAR | Status: DC | PRN
Start: 2015-04-02 — End: 2015-04-02
  Administered 2015-04-02 (×2): 0.5 mg via INTRAVENOUS

## 2015-04-02 MED ORDER — TRANEXAMIC ACID 1000 MG/10ML IV SOLN
1000.0000 mg | INTRAVENOUS | Status: AC
  Administered 2015-04-02: 1000 mg via INTRAVENOUS
  Filled 2015-04-02: qty 10

## 2015-04-02 MED ORDER — MENTHOL 3 MG MT LOZG: 1.0000 | LOZENGE | OROMUCOSAL | Status: DC | PRN

## 2015-04-02 MED ORDER — ACETAMINOPHEN 650 MG RE SUPP: 650.0000 mg | Freq: Four times a day (QID) | RECTAL | Status: DC | PRN

## 2015-04-02 MED ORDER — HYDROMORPHONE HCL 1 MG/ML IJ SOLN
INTRAMUSCULAR | Status: AC
  Filled 2015-04-02: qty 1

## 2015-04-02 MED ORDER — MEPERIDINE HCL 50 MG/ML IJ SOLN: 6.2500 mg | INTRAMUSCULAR | Status: DC | PRN

## 2015-04-02 MED ORDER — KETOROLAC TROMETHAMINE 15 MG/ML IJ SOLN
7.5000 mg | Freq: Four times a day (QID) | INTRAMUSCULAR | Status: AC
  Administered 2015-04-02 – 2015-04-03 (×3): 7.5 mg via INTRAVENOUS
  Filled 2015-04-02 (×3): qty 1

## 2015-04-02 MED ORDER — INFLUENZA VAC SPLIT QUAD 0.5 ML IM SUSY
0.5000 mL | PREFILLED_SYRINGE | INTRAMUSCULAR | Status: AC
  Administered 2015-04-03: 0.5 mL via INTRAMUSCULAR
  Filled 2015-04-02 (×2): qty 0.5

## 2015-04-02 MED ORDER — TRIAMTERENE-HCTZ 37.5-25 MG PO CAPS
1.0000 | ORAL_CAPSULE | Freq: Every day | ORAL | Status: DC
  Filled 2015-04-02 (×4): qty 1

## 2015-04-02 MED ORDER — PHENYLEPHRINE HCL 10 MG/ML IJ SOLN
INTRAMUSCULAR | Status: DC | PRN
  Administered 2015-04-02 (×2): 80 ug via INTRAVENOUS
  Administered 2015-04-02: 40 ug via INTRAVENOUS
  Administered 2015-04-02: 80 ug via INTRAVENOUS
  Administered 2015-04-02: 40 ug via INTRAVENOUS
  Administered 2015-04-02: 80 ug via INTRAVENOUS
  Administered 2015-04-02: 40 ug via INTRAVENOUS
  Administered 2015-04-02: 80 ug via INTRAVENOUS

## 2015-04-02 MED ORDER — METHOCARBAMOL 500 MG PO TABS
500.0000 mg | ORAL_TABLET | Freq: Four times a day (QID) | ORAL | Status: DC | PRN
  Administered 2015-04-02 – 2015-04-03 (×3): 500 mg via ORAL
  Filled 2015-04-02 (×3): qty 1

## 2015-04-02 MED ORDER — PROPOFOL INFUSION 10 MG/ML OPTIME
INTRAVENOUS | Status: DC | PRN
  Administered 2015-04-02: 10 ug/kg/min via INTRAVENOUS

## 2015-04-02 MED ORDER — ALUM & MAG HYDROXIDE-SIMETH 200-200-20 MG/5ML PO SUSP: 30.0000 mL | ORAL | Status: DC | PRN

## 2015-04-02 MED ORDER — ONDANSETRON HCL 4 MG/2ML IJ SOLN
4.0000 mg | Freq: Four times a day (QID) | INTRAMUSCULAR | Status: DC | PRN
  Administered 2015-04-03: 4 mg via INTRAVENOUS
  Filled 2015-04-02: qty 2

## 2015-04-02 MED ORDER — LIDOCAINE HCL (CARDIAC) 20 MG/ML IV SOLN
INTRAVENOUS | Status: AC
  Filled 2015-04-02: qty 5

## 2015-04-02 MED ORDER — ASPIRIN EC 325 MG PO TBEC
325.0000 mg | DELAYED_RELEASE_TABLET | Freq: Two times a day (BID) | ORAL | Status: DC
  Administered 2015-04-02 – 2015-04-05 (×6): 325 mg via ORAL
  Filled 2015-04-02 (×9): qty 1

## 2015-04-02 MED ORDER — SIMVASTATIN 20 MG PO TABS
20.0000 mg | ORAL_TABLET | Freq: Every day | ORAL | Status: DC
  Administered 2015-04-02 – 2015-04-04 (×3): 20 mg via ORAL
  Filled 2015-04-02 (×4): qty 1

## 2015-04-02 MED ORDER — METOCLOPRAMIDE HCL 10 MG PO TABS
5.0000 mg | ORAL_TABLET | Freq: Three times a day (TID) | ORAL | Status: DC | PRN
  Administered 2015-04-03: 10 mg via ORAL
  Filled 2015-04-02: qty 1

## 2015-04-02 MED ORDER — PROPOFOL 10 MG/ML IV BOLUS
INTRAVENOUS | Status: AC
  Filled 2015-04-02: qty 40

## 2015-04-02 MED ORDER — METOCLOPRAMIDE HCL 5 MG/ML IJ SOLN
INTRAMUSCULAR | Status: AC
  Filled 2015-04-02: qty 2

## 2015-04-02 MED ORDER — SODIUM CHLORIDE 0.9 % IV SOLN
INTRAVENOUS | Status: DC
  Administered 2015-04-02 – 2015-04-03 (×2): via INTRAVENOUS

## 2015-04-02 MED ORDER — LACTATED RINGERS IV SOLN
INTRAVENOUS | Status: DC
  Administered 2015-04-02 (×2): via INTRAVENOUS
  Administered 2015-04-02: 1000 mL via INTRAVENOUS

## 2015-04-02 MED ORDER — HYDROMORPHONE HCL 1 MG/ML IJ SOLN
1.0000 mg | INTRAMUSCULAR | Status: DC | PRN
  Administered 2015-04-02: 1 mg via INTRAVENOUS
  Filled 2015-04-02: qty 1

## 2015-04-02 MED ORDER — SODIUM CHLORIDE 0.9 % IR SOLN
Status: DC | PRN
  Administered 2015-04-02: 1000 mL

## 2015-04-02 MED ORDER — PROMETHAZINE HCL 25 MG/ML IJ SOLN: 6.2500 mg | INTRAMUSCULAR | Status: DC | PRN

## 2015-04-02 MED ORDER — LACTATED RINGERS IV SOLN: INTRAVENOUS | Status: DC

## 2015-04-02 MED ORDER — METHOCARBAMOL 1000 MG/10ML IJ SOLN
500.0000 mg | Freq: Four times a day (QID) | INTRAVENOUS | Status: DC | PRN
  Administered 2015-04-02: 500 mg via INTRAVENOUS
  Filled 2015-04-02 (×2): qty 5

## 2015-04-02 MED ORDER — PHENOL 1.4 % MT LIQD: 1.0000 | OROMUCOSAL | Status: DC | PRN

## 2015-04-02 MED ORDER — CEFAZOLIN SODIUM-DEXTROSE 2-3 GM-% IV SOLR
2.0000 g | INTRAVENOUS | Status: AC
  Administered 2015-04-02: 2 g via INTRAVENOUS

## 2015-04-02 MED ORDER — ONDANSETRON HCL 4 MG PO TABS
4.0000 mg | ORAL_TABLET | Freq: Four times a day (QID) | ORAL | Status: DC | PRN
  Administered 2015-04-03: 4 mg via ORAL
  Filled 2015-04-02: qty 1

## 2015-04-02 MED ORDER — PHENYLEPHRINE 40 MCG/ML (10ML) SYRINGE FOR IV PUSH (FOR BLOOD PRESSURE SUPPORT)
PREFILLED_SYRINGE | INTRAVENOUS | Status: AC
  Filled 2015-04-02: qty 10

## 2015-04-02 MED ORDER — DOCUSATE SODIUM 100 MG PO CAPS
100.0000 mg | ORAL_CAPSULE | Freq: Two times a day (BID) | ORAL | Status: DC
  Administered 2015-04-02 – 2015-04-05 (×5): 100 mg via ORAL

## 2015-04-02 MED ORDER — PROPOFOL 10 MG/ML IV BOLUS
INTRAVENOUS | Status: AC
  Filled 2015-04-02: qty 20

## 2015-04-02 MED ORDER — ACETAMINOPHEN 325 MG PO TABS
650.0000 mg | ORAL_TABLET | Freq: Four times a day (QID) | ORAL | Status: DC | PRN
  Administered 2015-04-03 – 2015-04-05 (×3): 650 mg via ORAL
  Filled 2015-04-02 (×4): qty 2

## 2015-04-02 MED ORDER — DIPHENHYDRAMINE HCL 12.5 MG/5ML PO ELIX: 12.5000 mg | ORAL_SOLUTION | ORAL | Status: DC | PRN

## 2015-04-02 MED ORDER — CEFAZOLIN SODIUM-DEXTROSE 2-3 GM-% IV SOLR
INTRAVENOUS | Status: AC
  Filled 2015-04-02: qty 50

## 2015-04-02 MED ORDER — DEXAMETHASONE SODIUM PHOSPHATE 10 MG/ML IJ SOLN
INTRAMUSCULAR | Status: AC
  Filled 2015-04-02: qty 1

## 2015-04-02 MED ORDER — MIDAZOLAM HCL 2 MG/2ML IJ SOLN
INTRAMUSCULAR | Status: AC
  Filled 2015-04-02: qty 4

## 2015-04-02 MED ORDER — METOCLOPRAMIDE HCL 5 MG/ML IJ SOLN
5.0000 mg | Freq: Three times a day (TID) | INTRAMUSCULAR | Status: DC | PRN
  Administered 2015-04-03: 10 mg via INTRAVENOUS
  Filled 2015-04-02: qty 2

## 2015-04-02 SURGICAL SUPPLY — 43 items
BAG ZIPLOCK 12X15 (MISCELLANEOUS) IMPLANT
BENZOIN TINCTURE PRP APPL 2/3 (GAUZE/BANDAGES/DRESSINGS) ×3 IMPLANT
BLADE SAW SGTL 18X1.27X75 (BLADE) ×2 IMPLANT
BLADE SAW SGTL 18X1.27X75MM (BLADE) ×1
CAPT HIP TOTAL 2 ×3 IMPLANT
CELLS DAT CNTRL 66122 CELL SVR (MISCELLANEOUS) ×1 IMPLANT
CLOSURE WOUND 1/2 X4 (GAUZE/BANDAGES/DRESSINGS) ×1
COVER PERINEAL POST (MISCELLANEOUS) ×3 IMPLANT
DRAPE C-ARM 42X120 X-RAY (DRAPES) ×3 IMPLANT
DRAPE STERI IOBAN 125X83 (DRAPES) ×3 IMPLANT
DRAPE U-SHAPE 47X51 STRL (DRAPES) ×9 IMPLANT
DRSG AQUACEL AG ADV 3.5X 6 (GAUZE/BANDAGES/DRESSINGS) ×3 IMPLANT
DRSG AQUACEL AG ADV 3.5X10 (GAUZE/BANDAGES/DRESSINGS) ×3 IMPLANT
DURAPREP 26ML APPLICATOR (WOUND CARE) ×3 IMPLANT
ELECT BLADE TIP CTD 4 INCH (ELECTRODE) ×3 IMPLANT
ELECT REM PT RETURN 9FT ADLT (ELECTROSURGICAL) ×3
ELECTRODE REM PT RTRN 9FT ADLT (ELECTROSURGICAL) ×1 IMPLANT
FACESHIELD WRAPAROUND (MASK) ×12 IMPLANT
GAUZE XEROFORM 1X8 LF (GAUZE/BANDAGES/DRESSINGS) IMPLANT
GLOVE BIO SURGEON STRL SZ7.5 (GLOVE) ×3 IMPLANT
GLOVE BIOGEL PI IND STRL 8 (GLOVE) ×2 IMPLANT
GLOVE BIOGEL PI INDICATOR 8 (GLOVE) ×4
GLOVE ECLIPSE 8.0 STRL XLNG CF (GLOVE) ×3 IMPLANT
GOWN STRL REUS W/TWL XL LVL3 (GOWN DISPOSABLE) ×6 IMPLANT
HANDPIECE INTERPULSE COAX TIP (DISPOSABLE) ×2
KIT BASIN OR (CUSTOM PROCEDURE TRAY) ×3 IMPLANT
PACK TOTAL JOINT (CUSTOM PROCEDURE TRAY) ×3 IMPLANT
PEN SKIN MARKING BROAD (MISCELLANEOUS) ×3 IMPLANT
RTRCTR WOUND ALEXIS 18CM MED (MISCELLANEOUS) ×3
SET HNDPC FAN SPRY TIP SCT (DISPOSABLE) ×1 IMPLANT
STAPLER VISISTAT 35W (STAPLE) IMPLANT
STRIP CLOSURE SKIN 1/2X4 (GAUZE/BANDAGES/DRESSINGS) ×2 IMPLANT
SUT ETHIBOND NAB CT1 #1 30IN (SUTURE) ×3 IMPLANT
SUT MNCRL AB 4-0 PS2 18 (SUTURE) ×3 IMPLANT
SUT VIC AB 0 CT1 36 (SUTURE) ×3 IMPLANT
SUT VIC AB 1 CT1 36 (SUTURE) ×3 IMPLANT
SUT VIC AB 2-0 CT1 27 (SUTURE) ×4
SUT VIC AB 2-0 CT1 TAPERPNT 27 (SUTURE) ×2 IMPLANT
TOWEL OR 17X26 10 PK STRL BLUE (TOWEL DISPOSABLE) ×3 IMPLANT
TOWEL OR NON WOVEN STRL DISP B (DISPOSABLE) ×3 IMPLANT
TRAY FOLEY W/METER SILVER 14FR (SET/KITS/TRAYS/PACK) ×3 IMPLANT
TRAY FOLEY W/METER SILVER 16FR (SET/KITS/TRAYS/PACK) IMPLANT
YANKAUER SUCT BULB TIP 10FT TU (MISCELLANEOUS) ×3 IMPLANT

## 2015-04-02 NOTE — Evaluation (Signed)
Physical Therapy Evaluation Patient Details Name: Sara Santiago MRN: [id number] DOB: [birth year] Today's Date: 04/02/2015   History of Present Illness  R THR  Clinical Impression  Pt s/p R THR presents with decreased R LE strength/ROM and post op pain limiting functional mobility.  Pt should progress well to dc home with family assist and HHPT follow up.    Follow Up Recommendations Home health PT    Equipment Recommendations  Rolling walker with 5" wheels    Recommendations for Other Services OT consult     Precautions / Restrictions Precautions Precautions: Fall Restrictions Weight Bearing Restrictions: No Other Position/Activity Restrictions: WBAT      Mobility  Bed Mobility Overal bed mobility: Needs Assistance Bed Mobility: Supine to Sit     Supine to sit: Min assist     General bed mobility comments: cues for sequence and use of L LE to self assist  Transfers Overall transfer level: Needs assistance Equipment used: Rolling walker (2 wheeled) Transfers: Sit to/from Stand Sit to Stand: Min assist         General transfer comment: cues for LE management and use of UEs to self assist  Ambulation/Gait Ambulation/Gait assistance: Min assist Ambulation Distance (Feet): 100 Feet Assistive device: Rolling walker (2 wheeled) Gait Pattern/deviations: Step-to pattern;Decreased step length - right;Decreased step length - left;Shuffle;Trunk flexed     General Gait Details: cues for sequence, posture and position from [employer]            Wheelchair Mobility    Modified Rankin (Stroke Patients Only)       Balance                                             Pertinent Vitals/Pain Pain Assessment: 0-10 Pain Score: 3  Pain Location: R hip/thigh Pain Descriptors / Indicators: Aching;Burning Pain Intervention(s): Limited activity within patient's tolerance;Monitored during session;Premedicated before session;Ice applied    Home  Living Family/patient expects to be discharged to:: Private residence Living Arrangements: Spouse/significant other Available Help at Discharge: Family Type of Home: House Home Access: Stairs to enter Entrance Stairs-Rails: Doctor, general practice of Steps: 3 Home Layout: Two level;Able to live on main level with bedroom/bathroom Home Equipment: Gilmer Mor - single point      Prior Function Level of Independence: Independent               Hand Dominance        Extremity/Trunk Assessment   Upper Extremity Assessment: Overall WFL for tasks assessed           Lower Extremity Assessment: RLE deficits/detail      Cervical / Trunk Assessment: Normal  Communication   Communication: No difficulties  Cognition Arousal/Alertness: Awake/alert Behavior During Therapy: WFL for tasks assessed/performed Overall Cognitive Status: Within Functional Limits for tasks assessed                      General Comments      Exercises Total Joint Exercises Ankle Circles/Pumps: AROM;Both;15 reps;Supine      Assessment/Plan    PT Assessment Patient needs continued PT services  PT Diagnosis Difficulty walking   PT Problem List Decreased strength;Decreased range of motion;Decreased activity tolerance;Decreased balance;Decreased mobility;Decreased knowledge of use of DME;Pain  PT Treatment Interventions DME instruction;Gait training;Stair training;Functional mobility training;Therapeutic activities;Therapeutic exercise;Patient/family education   PT Goals (Current goals can  be found in the Care Plan section) Acute Rehab PT Goals Patient Stated Goal: Resume previous lifestyle with decreased pain PT Goal Formulation: With patient Time For Goal Achievement: 04/06/15 Potential to Achieve Goals: Good    Frequency 7X/week   Barriers to discharge        Co-evaluation               End of Session Equipment Utilized During Treatment: Gait belt Activity Tolerance:  Patient tolerated treatment well Patient left: in chair;with call bell/phone within reach Nurse Communication: Mobility status         Time: 1610-9604 PT Time Calculation (min) (ACUTE ONLY): 25 min   Charges:   PT Evaluation $Initial PT Evaluation Tier I: 1 Procedure PT Treatments $Gait Training: 8-22 mins   PT G Codes:        Sara Santiago 2015/04/10, 5:01 PM

## 2015-04-02 NOTE — Brief Op Note (Signed)
04/02/2015  11:17 AM  PATIENT:  Sara Santiago  59 y.o. female  PRE-OPERATIVE DIAGNOSIS:  Osteoarthritis right hip  POST-OPERATIVE DIAGNOSIS:  Osteoarthritis right hip  PROCEDURE:  Procedure(s): RIGHT TOTAL HIP ARTHROPLASTY ANTERIOR APPROACH (Right)  SURGEON:  Surgeon(s) and Role:    * Kathryne Hitch, MD - Primary  PHYSICIAN ASSISTANT: Rexene Edison, PA-C  ANESTHESIA:   spinal  EBL:  Total I/O In: 2000 [I.V.:2000] Out: 200 [Urine:100; Blood:100]  BLOOD ADMINISTERED:none  DRAINS: none   LOCAL MEDICATIONS USED:  NONE  SPECIMEN:  No Specimen  DISPOSITION OF SPECIMEN:  N/A  COUNTS:  YES  TOURNIQUET:  * No tourniquets in log *  DICTATION: .Other Dictation: Dictation Number [id number]  PLAN OF CARE: Admit to inpatient   PATIENT DISPOSITION:  PACU - hemodynamically stable.   Delay start of Pharmacological VTE agent (>24hrs) due to surgical blood loss or risk of bleeding: no

## 2015-04-02 NOTE — H&P (Signed)
TOTAL HIP ADMISSION H&P  Patient is admitted for right total hip arthroplasty.  Subjective:  Chief Complaint: right hip pain  HPI: Sara Santiago, 59 y.o. female, has a history of pain and functional disability in the right hip(s) due to arthritis and patient has failed non-surgical conservative treatments for greater than 12 weeks to include NSAID's and/or analgesics, corticosteriod injections, flexibility and strengthening excercises, weight reduction as appropriate and activity modification.  Onset of symptoms was gradual starting 5 years ago with gradually worsening course since that time.The patient noted no past surgery on the right hip(s).  Patient currently rates pain in the right hip at 10 out of 10 with activity. Patient has night pain, worsening of pain with activity and weight bearing, pain that interfers with activities of daily living and pain with passive range of motion. Patient has evidence of subchondral sclerosis, periarticular osteophytes and joint space narrowing by imaging studies. This condition presents safety issues increasing the risk of falls.  There is no current active infection.  Patient Active Problem List   Diagnosis Date Noted  . Osteoarthritis of right hip 04/02/2015   Past Medical History  Diagnosis Date  . Complication of anesthesia   . PONV (postoperative nausea and vomiting)   . Meniere's disease of left ear     uses Triamterene-Hydrochlorothizide for this  . Hypothyroidism   . Depression   . Arthritis     Past Surgical History  Procedure Laterality Date  . Abdominal hysterectomy    . Cervical fusion       C5-6, caused pain post-op so now on Tegretol  . Tonsillectomy      as child  . Laser ablation      on retina-right eye    No prescriptions prior to admission   No Known Allergies  Social History  Substance Use Topics  . Smoking status: Never Smoker   . Smokeless tobacco: Not on file  . Alcohol Use: Yes     Comment: occassionally     No family history on file.   Review of Systems  Musculoskeletal: Positive for joint pain.  All other systems reviewed and are negative.   Objective:  Physical Exam  Constitutional: She is oriented to person, place, and time. She appears well-developed and well-nourished.  HENT:  Head: Normocephalic and atraumatic.  Eyes: EOM are normal. Pupils are equal, round, and reactive to light.  Neck: Normal range of motion. Neck supple.  Cardiovascular: Normal rate and regular rhythm.   Respiratory: Effort normal and breath sounds normal.  GI: Soft. Bowel sounds are normal.  Musculoskeletal:       Right hip: She exhibits decreased range of motion, decreased strength, tenderness and bony tenderness.  Neurological: She is alert and oriented to person, place, and time.  Skin: Skin is warm and dry.  Psychiatric: She has a normal mood and affect.    Vital signs in last 24 hours:    Labs:   Estimated body mass index is 29.86 kg/(m^2) as calculated from the following:   Height as of 11/27/11: 5' 7.5" (1.715 m).   Weight as of 11/27/11: 87.816 kg (193 lb 9.6 oz).   Imaging Review Plain radiographs demonstrate moderate degenerative joint disease of the right hip(s). The bone quality appears to be excellent for age and reported activity level.  Assessment/Plan:  End stage arthritis, right hip(s)  The patient history, physical examination, clinical judgement of the provider and imaging studies are consistent with end stage degenerative joint disease of  the right hip(s) and total hip arthroplasty is deemed medically necessary. The treatment options including medical management, injection therapy, arthroscopy and arthroplasty were discussed at length. The risks and benefits of total hip arthroplasty were presented and reviewed. The risks due to aseptic loosening, infection, stiffness, dislocation/subluxation,  thromboembolic complications and other imponderables were discussed.  The patient  acknowledged the explanation, agreed to proceed with the plan and consent was signed. Patient is being admitted for inpatient treatment for surgery, pain control, PT, OT, prophylactic antibiotics, VTE prophylaxis, progressive ambulation and ADL's and discharge planning.The patient is planning to be discharged home with home health services

## 2015-04-02 NOTE — Op Note (Signed)
NAME:  Sara Santiago, DOWDLE NO.:  [id number]  MEDICAL RECORD NO.:  [id number]  LOCATION:  1615                         FACILITY:  [facility]  PHYSICIAN:  Vanita Panda. Magnus Ivan, M.D.DATE OF BIRTH:  03-May-1956  DATE OF PROCEDURE: DATE OF DISCHARGE:  04/02/2015                              OPERATIVE REPORT   PREOPERATIVE DIAGNOSIS:  Primary osteoarthritis and degenerative joint disease of femoral acetabular impingement, right hip.  POSTOPERATIVE DIAGNOSIS:  Primary osteoarthritis and degenerative joint disease of femoral acetabular impingement, right hip.  PROCEDURE:  Right total hip arthroplasty, direct anterior approach.  IMPLANTS:  DePuy Sector Gription acetabular component size 52, size 36+ 0 neutral polyethylene liner, size 12 Corail femoral component with standard offset, size 36+ 5 ceramic hip ball.  SURGEON:  Vanita Panda. Magnus Ivan, MD  ASSISTANT:  Richardean Canal, PA-C  ANESTHESIA:  Spinal.  ANTIBIOTICS:  2 g IV Ancef.  BLOOD LOSS:  200 mL.  COMPLICATIONS:  None.  INDICATIONS:  Ms. Sara Santiago is 52 year well-known to me, whom I have been seeing her for long time now for femoral acetabular impingement involving her right hip.  She has tried and failed all forms of conservative treatment.  Her pain now is daily, it has affected her quality of life, her mobility, and her activities of daily living.  At this point, she does wish to proceed with a total hip arthroplasty through direct anterior approach.  She understands the risk of acute blood loss, anemia, nerve and vessel injury, fracture, infection, and DVT, as well as dislocation.  She understands our goals are decreased pain, improved mobility, and overall improved quality of life.  PROCEDURE DESCRIPTION:  After informed consent was obtained, appropriate right hip was marked.  She was brought to the operating room and spinal anesthesia was obtained while she was on her stretcher.  She was laid in the  supine position on the stretcher.  Foley catheter was placed and then both feet had traction boots applied to them.  Next, she was placed supine on the Hana fracture table with the perineal post in place and both legs in inline skeletal traction devices, but no traction applied. Her right operative hip was prepped and draped with DuraPrep and sterile drapes.  Time-out was called.  She was identified as correct patient, correct right hip.  We then made an incision inferior and posterior to the anterior superior iliac spine and carried this obliquely down the leg.  We dissected down the tensor fascia lata muscle and tensor fascia was then divided longitudinally, we could proceed with a direct anterior approach to the hip.  We identified and cauterized the lateral femoral circumflex vessels and then divided the hip capsule.  We placed Cobra retractors around the medial and lateral femoral neck and then opened up the hip capsule finding a large joint effusion.  We placed Cobra retractors within the hip capsule.  I then made our femoral neck cut proximal to lesser trochanter using oscillating saw, and completed this on osteotome.  I placed a corkscrew guide in the femoral head and removed the femoral head in its entirety, finding a devoid of cartilage. We then placed a bent Hohmann over the medial acetabular  rim.  I released the transverse acetabular ligament and then removed remnants of acetabular labrum from around the hip socket.  We then began reaming under direct visualization from a size 42 reamer all the way up to a size 52 reamer in 2 mm increments.  Again all reamers were placed under direct visualization and last reamer also under direct fluoroscopy, so we could obtain our depth of reaming, our inclination, and anteversion. Once we obtained this, I placed the real DePuy Sector Gription acetabular component size 52, and then the real 36+ 0 neutral polyethylene liner for the size  acetabular component.  Attention was then turned to the femur with the leg externally rotated to 100 degrees extended and adducted.  We were able to place a Mueller retractor medially and a Hohmann retractor around the greater trochanter.  I released lateral joint capsule and used a box cutting osteotome in a femoral canal and a rongeur to lateralize and then we began broaching from a size 8 broach using Corail broaching system up to a size 12. With a size 12 was trialed a standard neck and a 36+ 1.5 hip ball, we rolled the leg back over and up with traction and internal rotation, reducing the pelvis.  She was stable on internal and external rotation, but I felt like she needed just a little bit more offset and leg length. We dislocated the hip, removed the trial components.  We then placed the real Corail femoral component from DePuy size 12 and then the real 36+ 5 ceramic hip ball, reduced this back in the acetabulum.  I was pleased with stability offset range of motion and leg lengths.  We then copiously irrigated the soft tissue with normal saline solution using pulsatile lavage.  We closed the hip capsule with interrupted #1 Ethibond suture followed by running #1 Vicryl in the tensor fascia, 0 Vicryl in the deep tissue, 2-0 Vicryl in subcutaneous tissue, 4-0 Monocryl subcuticular stitch, and Steri-Strips on the skin and an Aquacel dressing was applied.  She was then taken off the Hana table and taken to the recovery room in stable condition.  All final counts were correct.  There were no complications noted.  Of note, Richardean Canal, PA- C assisted the entire case.  His assistance was crucial for facilitating all aspects of this case.     Vanita Panda. Magnus Ivan, M.D.     CYB/MEDQ  D:  04/02/2015  T:  04/02/2015  Job:  [id number]

## 2015-04-02 NOTE — Anesthesia Procedure Notes (Signed)
Spinal Patient location during procedure: OR Start time: 04/02/2015 10:08 AM End time: 04/02/2015 10:12 AM Staffing Anesthesiologist: Suella Broad D Performed by: anesthesiologist  Preanesthetic Checklist Completed: patient identified, site marked, surgical consent, pre-op evaluation, timeout performed, IV checked, risks and benefits discussed and monitors and equipment checked Spinal Block Patient position: sitting Prep: Betadine Patient monitoring: heart rate, continuous pulse ox, blood pressure and cardiac monitor Approach: midline Location: L4-5 Injection technique: single-shot Needle Needle type: Whitacre and Introducer  Needle gauge: 24 G Needle length: 9 cm Additional Notes Negative paresthesia. Negative blood return. Positive free-flowing CSF. Expiration date of kit checked and confirmed. Patient tolerated procedure well, without complications.

## 2015-04-02 NOTE — Anesthesia Preprocedure Evaluation (Addendum)
Anesthesia Evaluation  Patient identified by MRN, date of birth, ID band Patient awake    Reviewed: Allergy & Precautions, NPO status , Patient's Chart, lab work & pertinent test results  History of Anesthesia Complications (+) PONV and history of anesthetic complications  Airway Mallampati: II  TM Distance: >3 FB Neck ROM: Full    Dental  (+) Teeth Intact   Pulmonary  breath sounds clear to auscultation        Cardiovascular negative cardio ROS  Rhythm:Regular Rate:Normal     Neuro/Psych PSYCHIATRIC DISORDERS Depression    GI/Hepatic negative GI ROS, Neg liver ROS,   Endo/Other  Hypothyroidism   Renal/GU negative Renal ROS  negative genitourinary   Musculoskeletal  (+) Arthritis -, Osteoarthritis,    Abdominal   Peds negative pediatric ROS (+)  Hematology negative hematology ROS (+)   Anesthesia Other Findings   Reproductive/Obstetrics                            Lab Results  Component Value Date   WBC 4.9 03/26/2015   HGB 11.5* 03/26/2015   HCT 35.4* 03/26/2015   MCV 98.1 03/26/2015   PLT 251 03/26/2015   Lab Results  Component Value Date   CREATININE 0.87 03/26/2015   BUN 12 03/26/2015   NA 138 03/26/2015   K 4.3 03/26/2015   CL 102 03/26/2015   CO2 28 03/26/2015   Lab Results  Component Value Date   INR 1.02 03/26/2015   INR 1.03 07/08/2009     Anesthesia Physical Anesthesia Plan  ASA: II  Anesthesia Plan: Spinal   Post-op Pain Management:    Induction: Intravenous  Airway Management Planned: Natural Airway and Simple Face Mask  Additional Equipment:   Intra-op Plan:   Post-operative Plan: Extubation in OR  Informed Consent: I have reviewed the patients History and Physical, chart, labs and discussed the procedure including the risks, benefits and alternatives for the proposed anesthesia with the patient or authorized representative who has indicated  his/her understanding and acceptance.   Dental advisory given  Plan Discussed with: CRNA  Anesthesia Plan Comments:         Anesthesia Quick Evaluation

## 2015-04-02 NOTE — Transfer of Care (Signed)
Immediate Anesthesia Transfer of Care Note  Patient: Sara Santiago  Procedure(s) Performed: Procedure(s): RIGHT TOTAL HIP ARTHROPLASTY ANTERIOR APPROACH (Right)  Patient Location: PACU  Anesthesia Type:Spinal  Level of Consciousness: awake, alert , oriented and patient cooperative  Airway & Oxygen Therapy: Patient Spontanous Breathing and Patient connected to face mask oxygen  Post-op Assessment: Report given to RN, Post -op Vital signs reviewed and stable and able to wiggle toes but denies any pain.  Post vital signs: Reviewed and stable  Last Vitals:  Filed Vitals:   04/02/15 0812  BP: 126/72  Pulse: 76  Temp: 36.9 C  Resp: 18    Complications: No apparent anesthesia complications

## 2015-04-02 NOTE — Anesthesia Postprocedure Evaluation (Signed)
  Anesthesia Post-op Note  Patient: Sara Santiago  Procedure(s) Performed: Procedure(s): RIGHT TOTAL HIP ARTHROPLASTY ANTERIOR APPROACH (Right)  Patient Location: PACU  Anesthesia Type:Spinal  Level of Consciousness: awake, alert  and oriented  Airway and Oxygen Therapy: Patient Spontanous Breathing and Patient connected to nasal cannula oxygen  Post-op Pain: none  Post-op Assessment: Post-op Vital signs reviewed and Patient's Cardiovascular Status Stable LLE Motor Response: Purposeful movement LLE Sensation: Full sensation RLE Motor Response: Purposeful movement RLE Sensation: Full sensation L Sensory Level: S1-Sole of foot, small toes R Sensory Level: S1-Sole of foot, small toes  Post-op Vital Signs: Reviewed and stable  Last Vitals:  Filed Vitals:   04/02/15 1819  BP: 113/60  Pulse: 82  Temp: 36.5 C  Resp: 16    Complications: No apparent anesthesia complications

## 2015-04-03 LAB — CBC
HEMATOCRIT: 27.4 % — AB (ref 36.0–46.0)
HEMOGLOBIN: 9 g/dL — AB (ref 12.0–15.0)
MCH: 31.8 pg (ref 26.0–34.0)
MCHC: 32.8 g/dL (ref 30.0–36.0)
MCV: 96.8 fL (ref 78.0–100.0)
Platelets: 149 10*3/uL — ABNORMAL LOW (ref 150–400)
RBC: 2.83 MIL/uL — AB (ref 3.87–5.11)
RDW: 12.7 % (ref 11.5–15.5)
WBC: 6.8 10*3/uL (ref 4.0–10.5)

## 2015-04-03 LAB — BASIC METABOLIC PANEL
Anion gap: 5 (ref 5–15)
BUN: 11 mg/dL (ref 6–20)
CHLORIDE: 98 mmol/L — AB (ref 101–111)
CO2: 29 mmol/L (ref 22–32)
CREATININE: 0.67 mg/dL (ref 0.44–1.00)
Calcium: 7.9 mg/dL — ABNORMAL LOW (ref 8.9–10.3)
GFR calc non Af Amer: >60 mL/min (ref >60)
Glucose, Bld: 117 mg/dL — ABNORMAL HIGH (ref 65–99)
POTASSIUM: 3.3 mmol/L — AB (ref 3.5–5.1)
Sodium: 132 mmol/L — ABNORMAL LOW (ref 135–145)

## 2015-04-03 MED ORDER — TRAMADOL HCL 50 MG PO TABS: 50.0000 mg | ORAL_TABLET | Freq: Four times a day (QID) | ORAL | Status: DC | PRN

## 2015-04-03 MED ORDER — PROMETHAZINE HCL 25 MG/ML IJ SOLN
12.5000 mg | Freq: Four times a day (QID) | INTRAMUSCULAR | Status: DC | PRN
  Administered 2015-04-04: 12.5 mg via INTRAVENOUS
  Filled 2015-04-03: qty 1

## 2015-04-03 MED ORDER — HYDROCODONE-ACETAMINOPHEN 5-325 MG PO TABS
1.0000 | ORAL_TABLET | ORAL | Status: DC | PRN
  Administered 2015-04-03 (×2): 1 via ORAL
  Filled 2015-04-03 (×2): qty 1

## 2015-04-03 NOTE — Progress Notes (Signed)
Physical Therapy Treatment Patient Details Name: Sara Santiago MRN: [id number] DOB: [birth year] Today's Date: 04-05-2015    History of Present Illness R THR    PT Comments    Pt continues motivated but ltd this am by nausea and vomiting.    Follow Up Recommendations  Home health PT     Equipment Recommendations  Rolling walker with 5" wheels    Recommendations for Other Services OT consult     Precautions / Restrictions Precautions Precautions: Fall Restrictions Weight Bearing Restrictions: No Other Position/Activity Restrictions: WBAT    Mobility  Bed Mobility Overal bed mobility: Needs Assistance Bed Mobility: Supine to Sit     Supine to sit: Min assist     General bed mobility comments: cues for sequence and use of L LE to self assist  Transfers Overall transfer level: Needs assistance Equipment used: Rolling walker (2 wheeled) Transfers: Sit to/from Stand Sit to Stand: Min assist         General transfer comment: cues for LE management and use of UEs to self assist  Ambulation/Gait Ambulation/Gait assistance: Min assist Ambulation Distance (Feet): 6 Feet Assistive device: Rolling walker (2 wheeled) Gait Pattern/deviations: Step-to pattern;Decreased step length - right;Decreased step length - left;Shuffle;Trunk flexed Gait velocity: decr   General Gait Details: cues for sequence, posture and position from RW - ltd by nausea   Stairs            Wheelchair Mobility    Modified Rankin (Stroke Patients Only)       Balance                                    Cognition Arousal/Alertness: Awake/alert Behavior During Therapy: WFL for tasks assessed/performed Overall Cognitive Status: Within Functional Limits for tasks assessed                      Exercises Total Joint Exercises Ankle Circles/Pumps: AROM;Both;15 reps;Supine Quad Sets: AROM;Both;10 reps;Supine Heel Slides: AAROM;Right;20 reps;Supine Hip  ABduction/ADduction: AAROM;Right;10 reps;Supine    General Comments        Pertinent Vitals/Pain Pain Assessment: 0-10 Pain Score: 4  Pain Location: R hip/thigh Pain Descriptors / Indicators: Aching;Sore Pain Intervention(s): Limited activity within patient's tolerance;Monitored during session;Premedicated before session;Ice applied    Home Living                      Prior Function            PT Goals (current goals can now be found in the care plan section) Acute Rehab PT Goals Patient Stated Goal: Resume previous lifestyle with decreased pain PT Goal Formulation: With patient Time For Goal Achievement: 04/06/15 Potential to Achieve Goals: Good Progress towards PT goals: Progressing toward goals    Frequency  7X/week    PT Plan Current plan remains appropriate    Co-evaluation             End of Session Equipment Utilized During Treatment: Gait belt Activity Tolerance: Other (comment) (nausea and vomiting) Patient left: in chair;with call bell/phone within reach     Time: 0815-0850 PT Time Calculation (min) (ACUTE ONLY): 35 min  Charges:  $Gait Training: 8-22 mins $Therapeutic Exercise: 8-22 mins                    G Codes:      Daimien Patmon 05-Apr-2015, 8:55 AM

## 2015-04-03 NOTE — Evaluation (Signed)
Occupational Therapy Evaluation Patient Details Name: Sara Santiago MRN: [id number] DOB: 1956-01-23 Today's Date: 04/03/2015    History of Present Illness R THR- anterior   Clinical Impression   Pt is s/p THA resulting in the deficits listed below (see OT Problem List).  Pt will benefit from skilled OT to increase their safety and independence with ADL and functional mobility for ADL to facilitate discharge to venue listed below.      Follow Up Recommendations  No OT follow up    Equipment Recommendations  Toilet riser       Precautions / Restrictions Precautions Precautions: Fall Restrictions Weight Bearing Restrictions: No Other Position/Activity Restrictions: WBAT      Mobility Bed Mobility Overal bed mobility: Needs Assistance Bed Mobility: Supine to Sit     Supine to sit: Min assist     General bed mobility comments: pti n chair  Transfers Overall transfer level: Needs assistance Equipment used: Rolling walker (2 wheeled) Transfers: Sit to/from Stand Sit to Stand: Min guard         General transfer comment: cues for LE management and use of UEs to self assist         ADL Overall ADL's : Needs assistance/impaired     Grooming: Wash/dry hands;Standing;Supervision/safety   Upper Body Bathing: Set up;Standing   Lower Body Bathing: Moderate assistance;Sit to/from stand   Upper Body Dressing : Set up;Sitting   Lower Body Dressing: Moderate assistance;Sit to/from stand   Toilet Transfer: Min guard;Comfort height toilet;Ambulation;RW   Toileting- Architect and Hygiene: Sit to/from stand         General ADL Comments: husband and friends will A as needed      Pertinent Vitals/Pain Pain Assessment: 0-10 Pain Score: 3  Pain Location: r hip Pain Descriptors / Indicators: Sore Pain Intervention(s): Monitored during session        Extremity/Trunk Assessment Upper Extremity Assessment Upper Extremity Assessment: Overall WFL  for tasks assessed           Communication Communication Communication: No difficulties   Cognition Arousal/Alertness: Awake/alert Behavior During Therapy: WFL for tasks assessed/performed Overall Cognitive Status: Within Functional Limits for tasks assessed                     General Comments    Pt doing GREAT!   Exercises Exercises: Total Joint     Shoulder Instructions      Home Living Family/patient expects to be discharged to:: Private residence Living Arrangements: Spouse/significant other Available Help at Discharge: Family Type of Home: House Home Access: Stairs to enter [employer] of Steps: 3 Entrance Stairs-Rails: Right;Left Home Layout: Two level;Able to live on main level with bedroom/bathroom Alternate Level Stairs-Number of Steps: 13 Alternate Level Stairs-Rails: Right Bathroom Shower/Tub: Producer, television/film/video: Standard     Home Equipment: Cane - single point          Prior Functioning/Environment Level of Independence: Independent             OT Diagnosis: Generalized weakness;Acute pain   OT Problem List: Decreased strength;Pain   OT Treatment/Interventions: Self-care/ADL training;DME and/or AE instruction;Patient/family education    OT Goals(Current goals can be found in the care plan section) Acute Rehab OT Goals Patient Stated Goal: Resume previous lifestyle with decreased pain OT Goal Formulation: With patient Time For Goal Achievement: 04/17/15 Potential to Achieve Goals: Good  OT Frequency: Min 2X/week   Barriers to D/C:  Co-evaluation              End of Session Nurse Communication: Mobility status  Activity Tolerance: Patient tolerated treatment well Patient left: in chair;with call bell/phone within reach;with family/visitor present   Time: 4098-1191 OT Time Calculation (min): 28 min Charges:  OT General Charges $OT Visit: 1 Procedure OT Evaluation $Initial OT  Evaluation Tier I: 1 Procedure OT Treatments $Self Care/Home Management : 8-22 mins G-Codes:    Einar Crow D 04/25/15, 11:02 AM

## 2015-04-03 NOTE — Progress Notes (Signed)
Physical Therapy Treatment Patient Details Name: Sara Santiago MRN: [id number] DOB: [birth year] Today's Date: 2015/04/19    History of Present Illness R THR    PT Comments    OOB deferred 2* increasing nausea with bed therex.  RN aware.  Follow Up Recommendations  Home health PT     Equipment Recommendations  Rolling walker with 5" wheels    Recommendations for Other Services OT consult     Precautions / Restrictions Precautions Precautions: Fall Restrictions Weight Bearing Restrictions: No Other Position/Activity Restrictions: WBAT    Mobility  Bed Mobility               General bed mobility comments: NT 2* increasing nausea  Transfers Overall transfer level: Needs assistance Equipment used: Rolling walker (2 wheeled) Transfers: Sit to/from Stand Sit to Stand: Min guard         General transfer comment: cues for LE management and use of UEs to self assist  Ambulation/Gait                 Stairs            Wheelchair Mobility    Modified Rankin (Stroke Patients Only)       Balance                                    Cognition Arousal/Alertness: Awake/alert Behavior During Therapy: WFL for tasks assessed/performed Overall Cognitive Status: Within Functional Limits for tasks assessed                      Exercises Total Joint Exercises Ankle Circles/Pumps: AROM;Both;15 reps;Supine Quad Sets: AROM;Both;10 reps;Supine Heel Slides: AAROM;Right;20 reps;Supine Hip ABduction/ADduction: AAROM;Right;10 reps;Supine    General Comments        Pertinent Vitals/Pain Pain Assessment: 0-10 Pain Score: 3  Pain Location: R hip Pain Descriptors / Indicators: Aching;Sore Pain Intervention(s): Limited activity within patient's tolerance;Monitored during session    Home Living Family/patient expects to be discharged to:: Private residence Living Arrangements: Spouse/significant other Available Help at Discharge:  Family Type of Home: House Home Access: Stairs to enter Entrance Stairs-Rails: Right;Left Home Layout: Two level;Able to live on main level with bedroom/bathroom Home Equipment: Gilmer Mor - single point      Prior Function Level of Independence: Independent          PT Goals (current goals can now be found in the care plan section) Acute Rehab PT Goals Patient Stated Goal: Resume previous lifestyle with decreased pain PT Goal Formulation: With patient Time For Goal Achievement: 04/06/15 Potential to Achieve Goals: Good Progress towards PT goals: Progressing toward goals    Frequency  7X/week    PT Plan Current plan remains appropriate    Co-evaluation             End of Session Equipment Utilized During Treatment: Gait belt Activity Tolerance: Other (comment) (nausea) Patient left: in bed;with call bell/phone within reach;with family/visitor present     Time: 4782-9562 PT Time Calculation (min) (ACUTE ONLY): 15 min  Charges:  $Therapeutic Exercise: 8-22 mins                    G Codes:      Holmes Hays April 19, 2015, 1:53 PM

## 2015-04-03 NOTE — Discharge Instructions (Signed)
INSTRUCTIONS AFTER JOINT REPLACEMENT   o Remove items at home which could result in a fall. This includes throw rugs or furniture in walking pathways o ICE to the affected joint every three hours while awake for 30 minutes at a time, for at least the first 3-5 days, and then as needed for pain and swelling.  Continue to use ice for pain and swelling. You may notice swelling that will progress down to the foot and ankle.  This is normal after surgery.  Elevate your leg when you are not up walking on it.   o Continue to use the breathing machine you got in the hospital (incentive spirometer) which will help keep your temperature down.  It is common for your temperature to cycle up and down following surgery, especially at night when you are not up moving around and exerting yourself.  The breathing machine keeps your lungs expanded and your temperature down.   DIET:  As you were doing prior to hospitalization, we recommend a well-balanced diet.  DRESSING / WOUND CARE / SHOWERING  You can get your current dressing wet daily in the shower.  You can leave your current dressing on until your outpatient follow-up.  ACTIVITY  o Increase activity slowly as tolerated, but follow the weight bearing instructions below.   o No driving for 6 weeks or until further direction given by your physician.  You cannot drive while taking narcotics.  o No lifting or carrying greater than 10 lbs. until further directed by your surgeon. o Avoid periods of inactivity such as sitting longer than an hour when not asleep. This helps prevent blood clots.  o You may return to work once you are authorized by your doctor.     WEIGHT BEARING   Weight bearing as tolerated with assist device (walker, cane, etc) as directed, use it as long as suggested by your surgeon or therapist, typically at least 4-6 weeks.   EXERCISES  Results after joint replacement surgery are often greatly improved when you follow the exercise,  range of motion and muscle strengthening exercises prescribed by your doctor. Safety measures are also important to protect the joint from further injury. Any time any of these exercises cause you to have increased pain or swelling, decrease what you are doing until you are comfortable again and then slowly increase them. If you have problems or questions, call your caregiver or physical therapist for advice.   Rehabilitation is important following a joint replacement. After just a few days of immobilization, the muscles of the leg can become weakened and shrink (atrophy).  These exercises are designed to build up the tone and strength of the thigh and leg muscles and to improve motion. Often times heat used for twenty to thirty minutes before working out will loosen up your tissues and help with improving the range of motion but do not use heat for the first two weeks following surgery (sometimes heat can increase post-operative swelling).   These exercises can be done on a training (exercise) mat, on the floor, on a table or on a bed. Use whatever works the best and is most comfortable for you.    Use music or television while you are exercising so that the exercises are a pleasant break in your day. This will make your life better with the exercises acting as a break in your routine that you can look forward to.   Perform all exercises about fifteen times, three times per day or as  directed.  You should exercise both the operative leg and the other leg as well.  Exercises include:    Quad Sets - Tighten up the muscle on the front of the thigh (Quad) and hold for 5-10 seconds.    Straight Leg Raises - With your knee straight (if you were given a brace, keep it on), lift the leg to 60 degrees, hold for 3 seconds, and slowly lower the leg.  Perform this exercise against resistance later as your leg gets stronger.   Leg Slides: Lying on your back, slowly slide your foot toward your buttocks, bending your  knee up off the floor (only go as far as is comfortable). Then slowly slide your foot back down until your leg is flat on the floor again.   Angel Wings: Lying on your back spread your legs to the side as far apart as you can without causing discomfort.   Hamstring Strength:  Lying on your back, push your heel against the floor with your leg straight by tightening up the muscles of your buttocks.  Repeat, but this time bend your knee to a comfortable angle, and push your heel against the floor.  You may put a pillow under the heel to make it more comfortable if necessary.   A rehabilitation program following joint replacement surgery can speed recovery and prevent re-injury in the future due to weakened muscles. Contact your doctor or a physical therapist for more information on knee rehabilitation.    CONSTIPATION  Constipation is defined medically as fewer than three stools per week and severe constipation as less than one stool per week.  Even if you have a regular bowel pattern at home, your normal regimen is likely to be disrupted due to multiple reasons following surgery.  Combination of anesthesia, postoperative narcotics, change in appetite and fluid intake all can affect your bowels.   YOU MUST use at least one of the following options; they are listed in order of increasing strength to get the job done.  They are all available over the counter, and you may need to use some, POSSIBLY even all of these options:    Drink plenty of fluids (prune juice may be helpful) and high fiber foods Colace 100 mg by mouth twice a day  Senokot for constipation as directed and as needed Dulcolax (bisacodyl), take with full glass of water  Miralax (polyethylene glycol) once or twice a day as needed.  If you have tried all these things and are unable to have a bowel movement in the first 3-4 days after surgery call either your surgeon or your primary doctor.    If you experience loose stools or diarrhea,  hold the medications until you stool forms back up.  If your symptoms do not get better within 1 week or if they get worse, check with your doctor.  If you experience "the worst abdominal pain ever" or develop nausea or vomiting, please contact the office immediately for further recommendations for treatment.   ITCHING:  If you experience itching with your medications, try taking only a single pain pill, or even half a pain pill at a time.  You can also use Benadryl over the counter for itching or also to help with sleep.   TED HOSE STOCKINGS:  Use stockings on both legs until for at least 2 weeks or as directed by physician office. They may be removed at night for sleeping.  MEDICATIONS:  See your medication summary on the After Visit  Summary that nursing will review with you.  You may have some home medications which will be placed on hold until you complete the course of blood thinner medication.  It is important for you to complete the blood thinner medication as prescribed.  PRECAUTIONS:  If you experience chest pain or shortness of breath - call 911 immediately for transfer to the hospital emergency department.   If you develop a fever greater that 101 F, purulent drainage from wound, increased redness or drainage from wound, foul odor from the wound/dressing, or calf pain - CONTACT YOUR SURGEON.                                                   FOLLOW-UP APPOINTMENTS:  If you do not already have a post-op appointment, please call the office for an appointment to be seen by your surgeon.  Guidelines for how soon to be seen are listed in your After Visit Summary, but are typically between 1-4 weeks after surgery.  OTHER INSTRUCTIONS:   Knee Replacement:  Do not place pillow under knee, focus on keeping the knee straight while resting. CPM instructions: 0-90 degrees, 2 hours in the morning, 2 hours in the afternoon, and 2 hours in the evening. Place foam block, curve side up under heel at  all times except when in CPM or when walking.  DO NOT modify, tear, cut, or change the foam block in any way.  MAKE SURE YOU:   Understand these instructions.   Get help right away if you are not doing well or get worse.    Thank you for letting us be a part of your medical care team.  It is a privilege we respect greatly.  We hope these instructions will help you stay on track for a fast and full recovery!

## 2015-04-03 NOTE — Progress Notes (Signed)
Physical Therapy Treatment Patient Details Name: Sara Santiago MRN: [id number] DOB: 1955/09/22 Today's Date: 24-Apr-2015    History of Present Illness R THR    PT Comments    Pt motivated but continues ltd by nausea  Follow Up Recommendations  Home health PT     Equipment Recommendations  Rolling walker with 5" wheels    Recommendations for Other Services OT consult     Precautions / Restrictions Precautions Precautions: Fall Restrictions Weight Bearing Restrictions: No Other Position/Activity Restrictions: WBAT    Mobility  Bed Mobility Overal bed mobility: Needs Assistance Bed Mobility: Supine to Sit     Supine to sit: Min guard     General bed mobility comments: min cues for sequence  Transfers Overall transfer level: Needs assistance Equipment used: Rolling walker (2 wheeled) Transfers: Sit to/from Stand Sit to Stand: Min guard         General transfer comment: cues for LE management and use of UEs to self assist  Ambulation/Gait Ambulation/Gait assistance: Min guard Ambulation Distance (Feet): 123 Feet Assistive device: Rolling walker (2 wheeled) Gait Pattern/deviations: Step-to pattern;Step-through pattern;Decreased step length - right;Decreased step length - left;Shuffle;Trunk flexed Gait velocity: decr   General Gait Details: cues for sequence, posture and position from RW - ltd by nausea   Stairs            Wheelchair Mobility    Modified Rankin (Stroke Patients Only)       Balance                                    Cognition Arousal/Alertness: Awake/alert Behavior During Therapy: WFL for tasks assessed/performed Overall Cognitive Status: Within Functional Limits for tasks assessed                      Exercises Total Joint Exercises Ankle Circles/Pumps: AROM;Both;15 reps;Supine Quad Sets: AROM;Both;10 reps;Supine Heel Slides: AAROM;Right;20 reps;Supine Hip ABduction/ADduction: AAROM;Right;10  reps;Supine    General Comments        Pertinent Vitals/Pain Pain Assessment: 0-10 Pain Score: 4  Pain Location: R hip Pain Descriptors / Indicators: Aching;Sore Pain Intervention(s): Limited activity within patient's tolerance;Monitored during session;Ice applied    Home Living                      Prior Function            PT Goals (current goals can now be found in the care plan section) Acute Rehab PT Goals Patient Stated Goal: Resume previous lifestyle with decreased pain PT Goal Formulation: With patient Time For Goal Achievement: 04/06/15 Potential to Achieve Goals: Good Progress towards PT goals: Progressing toward goals    Frequency  7X/week    PT Plan Current plan remains appropriate    Co-evaluation             End of Session Equipment Utilized During Treatment: Gait belt Activity Tolerance: Other (comment) (nausea) Patient left: in chair;with call bell/phone within reach;with family/visitor present     Time: 1914-7829 PT Time Calculation (min) (ACUTE ONLY): 22 min  Charges:  $Gait Training: 8-22 mins $Therapeutic Exercise: 8-22 mins                    G Codes:      Sara Santiago Apr 24, 2015, 4:25 PM

## 2015-04-03 NOTE — Progress Notes (Signed)
Subjective: 1 Day Post-Op Procedure(s) (LRB): RIGHT TOTAL HIP ARTHROPLASTY ANTERIOR APPROACH (Right) Patient reports pain as moderate.  More nausea today.  Acute blood loss anemia from surgery.  Objective: Vital signs in last 24 hours: Temp:  [97.7 F (36.5 C)-98.5 F (36.9 C)] 98.5 F (36.9 C) (09/03 1104) Pulse Rate:  [62-92] 63 (09/03 1104) Resp:  [15-16] 16 (09/03 1104) BP: (94-130)/(52-71) 107/63 mmHg (09/03 1104) SpO2:  [99 %-100 %] 100 % (09/03 1104)  Intake/Output from previous day: 09/02 0701 - 09/03 0700 In: 5722.5 [P.O.:1520; I.V.:3987.5; IV Piggyback:215] Out: 2100 [Urine:1900; Blood:200] Intake/Output this shift: Total I/O In: 528.8 [P.O.:240; I.V.:288.8] Out: 200 [Urine:200]   Recent Labs  04/03/15 0450  HGB 9.0*    Recent Labs  04/03/15 0450  WBC 6.8  RBC 2.83*  HCT 27.4*  PLT 149*    Recent Labs  04/03/15 0450  NA 132*  K 3.3*  CL 98*  CO2 29  BUN 11  CREATININE 0.67  GLUCOSE 117*  CALCIUM 7.9*   No results for input(s): LABPT, INR in the last 72 hours.  Sensation intact distally Intact pulses distally Dorsiflexion/Plantar flexion intact Incision: scant drainage  Assessment/Plan: 1 Day Post-Op Procedure(s) (LRB): RIGHT TOTAL HIP ARTHROPLASTY ANTERIOR APPROACH (Right) Up with therapy Discharge home with home health on Monday  Stuti Sandin Y 04/03/2015, 2:49 PM

## 2015-04-04 LAB — CBC
HEMATOCRIT: 25.2 % — AB (ref 36.0–46.0)
HEMOGLOBIN: 8.3 g/dL — AB (ref 12.0–15.0)
MCH: 32 pg (ref 26.0–34.0)
MCHC: 32.9 g/dL (ref 30.0–36.0)
MCV: 97.3 fL (ref 78.0–100.0)
Platelets: 127 10*3/uL — ABNORMAL LOW (ref 150–400)
RBC: 2.59 MIL/uL — ABNORMAL LOW (ref 3.87–5.11)
RDW: 13 % (ref 11.5–15.5)
WBC: 6.4 10*3/uL (ref 4.0–10.5)

## 2015-04-04 MED ORDER — GLYCERIN (LAXATIVE) 2.1 G RE SUPP
1.0000 | Freq: Every day | RECTAL | Status: DC | PRN
  Filled 2015-04-04: qty 1

## 2015-04-04 MED ORDER — TRAMADOL HCL 50 MG PO TABS
100.0000 mg | ORAL_TABLET | Freq: Four times a day (QID) | ORAL | Status: DC | PRN
  Administered 2015-04-05: 100 mg via ORAL
  Filled 2015-04-04: qty 2

## 2015-04-04 NOTE — Progress Notes (Signed)
Physical Therapy Treatment Patient Details Name: Sara Santiago MRN: [id number] DOB: 12-15-1955 Today's Date: 04/04/2015    History of Present Illness R THR    PT Comments    POD # 2 am session.  Pt getting to bathroom with spouse.  Assisted with amb in hallway.  No c/o nausea.  Feeling "a little bad".  Returned to room then performed all THR TE's following handout.  Instructed on proper tech and freq.  As well as use of ICE.  Pt plans to D/C tomorrow.  Follow Up Recommendations  Home health PT     Equipment Recommendations  Rolling walker with 5" wheels    Recommendations for Other Services       Precautions / Restrictions Precautions Precautions: Fall Restrictions Weight Bearing Restrictions: No Other Position/Activity Restrictions: WBAT    Mobility  Bed Mobility               General bed mobility comments: pt OOB in recliner  Transfers Overall transfer level: Needs assistance Equipment used: Rolling walker (2 wheeled) Transfers: Sit to/from Stand Sit to Stand: Supervision         General transfer comment: good safety cognition and use of hands  Ambulation/Gait Ambulation/Gait assistance: Supervision Ambulation Distance (Feet): 120 Feet Assistive device: Rolling walker (2 wheeled) Gait Pattern/deviations: Step-to pattern;Step-through pattern     General Gait Details: cues for sequence, posture and position from RW.  ledd c/o nausea   Stairs            Wheelchair Mobility    Modified Rankin (Stroke Patients Only)       Balance                                    Cognition Arousal/Alertness: Awake/alert Behavior During Therapy: WFL for tasks assessed/performed Overall Cognitive Status: Within Functional Limits for tasks assessed                      Exercises   Total Hip Replacement TE's 10 reps ankle pumps 10 reps knee presses 10 reps heel slides 10 reps SAQ's 10 reps ABD 10 reps standing hip  flex/ABD/ext and kick backs Followed by ICE     General Comments        Pertinent Vitals/Pain      Home Living                      Prior Function            PT Goals (current goals can now be found in the care plan section) Progress towards PT goals: Progressing toward goals    Frequency  7X/week    PT Plan Current plan remains appropriate    Co-evaluation             End of Session Equipment Utilized During Treatment: Gait belt Activity Tolerance: Patient tolerated treatment well Patient left: in chair;with call bell/phone within reach;with family/visitor present     Time: 1150-1230 PT Time Calculation (min) (ACUTE ONLY): 40 min  Charges:  $Gait Training: 8-22 mins $Therapeutic Exercise: 8-22 mins $Therapeutic Activity: 8-22 mins                    G Codes:      Felecia Shelling  PTA [facility]  Acute  Rehab Pager      [phone]

## 2015-04-04 NOTE — Progress Notes (Signed)
Patient ID: Sara Santiago, female   DOB: 05/01/1956, 59 y.o.   MRN: [id number] Patient had symptoms from the narcotic pain medicine last night. Patient states she's feeling much better. A prescription will be provided for tramadol we can discontinue her IV patient requests glycerin suppository anticipate discharge to home on Monday

## 2015-04-04 NOTE — Care Management Note (Signed)
Case Management Note  Patient Details  Name: KESHAWNA DIX MRN: [id number] Date of Birth: Oct 29, 1955  Subjective/Objective:   Right total hip athroplasty             Action/Plan:  Pt preoperative arranged with Genevieve Norlander for [facility]. Weekday NCM will follow up on 04/05/2015 for DME needs.    Expected Discharge Date:  04/04/15               Expected Discharge Plan:  Home w Home Health Services  In-House Referral:     Discharge planning Services  CM Consult  Post Acute Care Choice:  Home Health Choice offered to:      [facility] Arranged:    HH Agency:     Status of Service:  In process, will continue to follow  Medicare Important Message Given:    Date Medicare IM Given:    Medicare IM give by:    Date Additional Medicare IM Given:    Additional Medicare Important Message give by:     If discussed at Long Length of Stay Meetings, dates discussed:    Additional Comments:  Elliot Cousin, RN 04/04/2015, 6:38 PM

## 2015-04-05 LAB — CBC
HEMATOCRIT: 27.3 % — AB (ref 36.0–46.0)
Hemoglobin: 8.9 g/dL — ABNORMAL LOW (ref 12.0–15.0)
MCH: 32 pg (ref 26.0–34.0)
MCHC: 32.6 g/dL (ref 30.0–36.0)
MCV: 98.2 fL (ref 78.0–100.0)
Platelets: 153 10*3/uL (ref 150–400)
RBC: 2.78 MIL/uL — ABNORMAL LOW (ref 3.87–5.11)
RDW: 13 % (ref 11.5–15.5)
WBC: 6.5 10*3/uL (ref 4.0–10.5)

## 2015-04-05 MED ORDER — TRAMADOL HCL 50 MG PO TABS: 100.0000 mg | ORAL_TABLET | Freq: Four times a day (QID) | ORAL | Status: DC | PRN

## 2015-04-05 MED ORDER — TRIAMTERENE-HCTZ 37.5-25 MG PO TABS
1.0000 | ORAL_TABLET | Freq: Every day | ORAL | Status: DC
  Administered 2015-04-05: 1 via ORAL
  Filled 2015-04-05: qty 1

## 2015-04-05 MED ORDER — ASPIRIN 325 MG PO TBEC: 325.0000 mg | DELAYED_RELEASE_TABLET | Freq: Two times a day (BID) | ORAL | Status: AC

## 2015-04-05 MED ORDER — ONDANSETRON 4 MG PO TBDP: 4.0000 mg | ORAL_TABLET | Freq: Four times a day (QID) | ORAL | Status: DC | PRN

## 2015-04-05 MED ORDER — METHOCARBAMOL 500 MG PO TABS: 500.0000 mg | ORAL_TABLET | Freq: Four times a day (QID) | ORAL | Status: DC | PRN

## 2015-04-05 NOTE — Care Management Note (Signed)
Case Management Note  Patient Details  Name: Sara Santiago MRN: [id number] Date of Birth: 10/08/1955  Subjective/Objective:  Received call back from [place] on call nurse from [facility] to make home visit tomorrow.She also confirmed she received faxed info.                  Action/Plan:   Expected Discharge Date:                 Expected Discharge Plan:  Home w Home Health Services  In-House Referral:     Discharge planning Services  CM Consult  Post Acute Care Choice:  Home Health Choice offered to:     DME Arranged:    DME Agency:     HH Arranged:  PT HH Agency:  [facility]  Status of Service:  In process, will continue to follow  Medicare Important Message Given:    Date Medicare IM Given:    Medicare IM give by:    Date Additional Medicare IM Given:    Additional Medicare Important Message give by:     If discussed at Long Length of Stay Meetings, dates discussed:    Additional Comments:  Lanier Clam, RN 04/05/2015, 10:36 AM

## 2015-04-05 NOTE — Progress Notes (Signed)
Pt to d/c home with [facility]. DME delivered to room prior to d/c. AVS reviewed and "My Chart" discussed with pt. Pt capable of verbalizing medications, signs and symptoms of infection, and follow-up appointments. Remains hemodynamically stable. No signs and symptoms of distress. Educated pt to return to ER in the case of SOB, dizziness, or chest pain.  

## 2015-04-05 NOTE — Care Management Note (Signed)
Case Management Note  Patient Details  Name: Sara Santiago MRN: [id number] Date of Birth: Aug 08, 1955  Subjective/Objective:   Faxed face sheet,h&p, d/c summary, hhpt order,f18f to [phone] w/confirmation.[facility] for [facility]. TC main office-[phone], i will receive a call back from on call liason to confirm they are able to accept, & make home visit within 2 days of d/c.Awaiting dme to be delivered to rm prior [facility]. Nsg notified.                 Action/Plan:d/c plan home w/HHC/DME.   Expected Discharge Date:  04/04/15               Expected Discharge Plan:  Home w Home Health Services  In-House Referral:     Discharge planning Services  CM Consult  Post Acute Care Choice:  Home Health Choice offered to:     DME Arranged:    DME Agency:     HH Arranged:  PT HH Agency:  [facility]  Status of Service:  In process, will continue to follow  Medicare Important Message Given:    Date Medicare IM Given:    Medicare IM give by:    Date Additional Medicare IM Given:    Additional Medicare Important Message give by:     If discussed at Long Length of Stay Meetings, dates discussed:    Additional Comments:  Lanier Clam, RN 04/05/2015, 10:29 AM

## 2015-04-05 NOTE — Progress Notes (Signed)
Occupational Therapy Treatment Patient Details Name: Sara Santiago MRN: [id number] DOB: August 30, 1955 Today's Date: 04/05/2015    History of present illness R THR   OT comments  Pt ready to DC home  Follow Up Recommendations  No OT follow up    Equipment Recommendations  Toilet riser       Precautions / Restrictions Precautions Precautions: Fall Restrictions Weight Bearing Restrictions: No Other Position/Activity Restrictions: WBAT       Mobility Bed Mobility               General bed mobility comments: pt OOB in recliner  Transfers Overall transfer level: Modified independent Equipment used: Rolling walker (2 wheeled) Transfers: Sit to/from Stand Sit to Stand: Supervision         General transfer comment: good safety cognition and use of hands    Balance                                   ADL Overall ADL's : Needs assistance/impaired                                       General ADL Comments: pt reports having min A with LB dressing but is aware of technique.Verbalized safety getting in and out of the shower as well as safety with walker for kitchen task.                 Cognition   Behavior During Therapy: WFL for tasks assessed/performed Overall Cognitive Status: Within Functional Limits for tasks assessed                                    Pertinent Vitals/ Pain       Pain Assessment: 0-10 Pain Score: 3  Pain Location: R hip Pain Descriptors / Indicators: Aching;Sore;Tightness Pain Intervention(s): Monitored during session;Repositioned;Ice applied  Home Living                                          Prior Functioning/Environment              Frequency Min 2X/week     Progress Toward Goals  OT Goals(current goals can now be found in the care plan section)  Progress towards OT goals: Progressing toward goals     Plan      Co-evaluation                  End of Session     Activity Tolerance Patient tolerated treatment well   Patient Left in chair;with call bell/phone within reach;with family/visitor present   Nurse Communication Mobility status         [name] 04/05/2015, 11:40 AM

## 2015-04-05 NOTE — Progress Notes (Signed)
Patient ID: Sara Santiago, female   DOB: 01/08/1956, 59 y.o.   MRN: [id number] Doing really well today.  Feels good.  Vitals stable.  Can discharge to home today.

## 2015-04-05 NOTE — Progress Notes (Signed)
Physical Therapy Treatment Patient Details Name: Sara Santiago MRN: [id number] DOB: 1956-04-20 Today's Date: 04/05/2015    History of Present Illness R THR    PT Comments    POD # 3 pt feeling much better and eager to D/C to home.  Assisted with amb a greater distance and perfomed a flight of stairs.  Returned to room then performed all THR TE's following HEP handout.  ICE applied.  All questions addressed. Pt ready for D/C to home.   Follow Up Recommendations  Home health PT     Equipment Recommendations  Rolling walker with 5" wheels;Crutches (one crutch was sized and issued)    Recommendations for Other Services       Precautions / Restrictions Precautions Precautions: Fall Restrictions Weight Bearing Restrictions: No Other Position/Activity Restrictions: WBAT    Mobility  Bed Mobility               General bed mobility comments: pt OOB in recliner  Transfers Overall transfer level: Modified independent Equipment used: Rolling walker (2 wheeled) Transfers: Sit to/from Stand Sit to Stand: Supervision         General transfer comment: good safety cognition and use of hands  Ambulation/Gait Ambulation/Gait assistance: Modified independent (Device/Increase time) Ambulation Distance (Feet): 155 Feet Assistive device: Rolling walker (2 wheeled) Gait Pattern/deviations: Step-to pattern;Step-through pattern;Decreased stance time - right     General Gait Details: increased distance and tolerance.   Stairs Stairs: Yes Stairs assistance: Min guard Stair Management: One rail Right;Step to pattern;Forwards;With crutches Number of Stairs: 10 General stair comments: 25% VC's on proper sequencing and crutch placement.  Performed a flight of stairs.  Wheelchair Mobility    Modified Rankin (Stroke Patients Only)       Balance                                    Cognition Arousal/Alertness: Awake/alert Behavior During Therapy: WFL for  tasks assessed/performed Overall Cognitive Status: Within Functional Limits for tasks assessed                      Exercises   Total Hip Replacement TE's 10 reps ankle pumps 10 reps knee presses 10 reps heel slides 10 reps SAQ's 10 reps ABD Followed by ICE     General Comments        Pertinent Vitals/Pain Pain Assessment: 0-10 Pain Score: 3  Pain Location: R hip Pain Descriptors / Indicators: Aching;Sore;Tightness Pain Intervention(s): Monitored during session;Repositioned;Ice applied    Home Living                      Prior Function            PT Goals (current goals can now be found in the care plan section) Progress towards PT goals: Progressing toward goals    Frequency  7X/week    PT Plan      Co-evaluation             End of Session Equipment Utilized During Treatment: Gait belt Activity Tolerance: Patient tolerated treatment well Patient left: in chair;with call bell/phone within reach;with family/visitor present     Time: 0925-1005 PT Time Calculation (min) (ACUTE ONLY): 40 min  Charges:  $Gait Training: 8-22 mins $Therapeutic Exercise: 8-22 mins $Therapeutic Activity: 8-22 mins  G Codes:      Rica Koyanagi  PTA [facility]  Acute  Rehab Pager      [phone]

## 2015-04-05 NOTE — Discharge Summary (Signed)
Patient ID: Sara Santiago MRN: [id number] DOB/AGE: Apr 04, 1956 59 y.o.  Admit date: 04/02/2015 Discharge date: 04/05/2015  Admission Diagnoses:  Principal Problem:   Osteoarthritis of right hip Active Problems:   Status post total replacement of right hip   Discharge Diagnoses:  Same  Past Medical History  Diagnosis Date  . Complication of anesthesia   . PONV (postoperative nausea and vomiting)   . Meniere's disease of left ear     uses Triamterene-Hydrochlorothizide for this  . Hypothyroidism   . Depression   . Arthritis     Surgeries: Procedure(s): RIGHT TOTAL HIP ARTHROPLASTY ANTERIOR APPROACH on 04/02/2015   Consultants:    Discharged Condition: Improved  Hospital Course: Sara Santiago is an 59 y.o. female who was admitted 04/02/2015 for operative treatment ofOsteoarthritis of right hip. Patient has severe unremitting pain that affects sleep, daily activities, and work/hobbies. After pre-op clearance the patient was taken to the operating room on 04/02/2015 and underwent  Procedure(s): RIGHT TOTAL HIP ARTHROPLASTY ANTERIOR APPROACH.    Patient was given perioperative antibiotics: Anti-infectives    Start     Dose/Rate Route Frequency Ordered Stop   04/02/15 1600  ceFAZolin (ANCEF) IVPB 1 g/50 mL premix     1 g 100 mL/hr over 30 Minutes Intravenous Every 6 hours 04/02/15 1253 04/02/15 2342   04/02/15 0805  ceFAZolin (ANCEF) IVPB 2 g/50 mL premix     2 g 100 mL/hr over 30 Minutes Intravenous On call to O.R. 04/02/15 0805 04/02/15 0955       Patient was given sequential compression devices, early ambulation, and chemoprophylaxis to prevent DVT.  Patient benefited maximally from hospital stay and there were no complications.    Recent vital signs: Patient Vitals for the past 24 hrs:  BP Temp Temp src Pulse Resp SpO2  04/05/15 0457 130/63 mmHg 99.1 F (37.3 C) Oral 75 16 100 %  04/04/15 2157 (!) 97/58 mmHg 98.8 F (37.1 C) Oral 67 16 99 %  04/04/15 1447 115/64 mmHg  99.3 F (37.4 C) Oral 83 16 98 %     Recent laboratory studies:  Recent Labs  04/03/15 0450 04/04/15 0556 04/05/15 0504  WBC 6.8 6.4 6.5  HGB 9.0* 8.3* 8.9*  HCT 27.4* 25.2* 27.3*  PLT 149* 127* 153  NA 132*  --   --   K 3.3*  --   --   CL 98*  --   --   CO2 29  --   --   BUN 11  --   --   CREATININE 0.67  --   --   GLUCOSE 117*  --   --   CALCIUM 7.9*  --   --      Discharge Medications:     Medication List    TAKE these medications        aspirin 325 MG EC tablet  Take 1 tablet (325 mg total) by mouth 2 (two) times daily after a meal.     CALCIUM PO  Take 1 each by mouth every morning.     carbamazepine 200 MG tablet  Commonly known as:  TEGRETOL  Take 1 tablet by mouth 2 (two) times daily.     levothyroxine 175 MCG tablet  Commonly known as:  SYNTHROID, LEVOTHROID  Take 175 mcg by mouth daily before breakfast.     methocarbamol 500 MG tablet  Commonly known as:  ROBAXIN  Take 1 tablet (500 mg total) by mouth every 6 (six) hours as  needed for muscle spasms.     MULTI ADULT GUMMIES PO  Take 1 each by mouth every morning.     naproxen sodium 220 MG tablet  Commonly known as:  ANAPROX  Take 220 mg by mouth 2 (two) times daily as needed (pain).     sertraline 100 MG tablet  Commonly known as:  ZOLOFT  Take 100 mg by mouth at bedtime.     simvastatin 20 MG tablet  Commonly known as:  ZOCOR  Take 20 mg by mouth at bedtime.     traMADol 50 MG tablet  Commonly known as:  ULTRAM  Take 2 tablets (100 mg total) by mouth every 6 (six) hours as needed for moderate pain or severe pain.     triamterene-hydrochlorothiazide 37.5-25 MG per capsule  Commonly known as:  DYAZIDE  Take 1 capsule by mouth every morning.        Diagnostic Studies: Dg C-arm 1-60 Min-no Report  04/02/2015   CLINICAL DATA: surgery   C-ARM 1-60 MINUTES  Fluoroscopy was utilized by the requesting physician.  No radiographic  interpretation.    Dg Hip Port Unilat With Pelvis 1v  Right  04/02/2015   CLINICAL DATA:  Patient status post right hip replacement.  EXAM: DG HIP (WITH OR WITHOUT PELVIS) 1V PORT RIGHT  COMPARISON:  04/02/2015  FINDINGS: Patient status post right hip replacement. Hardware appears in appropriate position. Soft tissue gas overlying the right hip. Pelvic phleboliths. No acute osseous abnormality.  IMPRESSION: Patient status post right hip replacement.   Electronically Signed   By: Annia Belt M.D.   On: 04/02/2015 13:05   Dg Hip Operative Unilat With Pelvis Right  04/02/2015   CLINICAL DATA:  RIGHT hip replacement  EXAM: DG C-ARM 1-60 MIN-NO REPORT; OPERATIVE RIGHT HIP WITH PELVIS  CONTRAST:  None  FLUOROSCOPY TIME:  Radiation Exposure Index (as provided by the fluoroscopic device): Not provided  If the device does not provide the exposure index:  Fluoroscopy Time (in minutes and seconds):  0 minutes 24 seconds  Number of Acquired Images:  2  COMPARISON:  None  FINDINGS: Imaging of the RIGHT hip demonstrates components of a RIGHT hip prosthesis in expected positions.  No fracture, dislocation or bone destruction.  No periprosthetic lucency.  IMPRESSION: RIGHT hip prosthesis without acute complication.   Electronically Signed   By: Ulyses Southward M.D.   On: 04/02/2015 11:44    Disposition: to home      Discharge Instructions    Discharge patient    Complete by:  As directed            Follow-up Information    Follow up with [facility].   Contact information:   [address] [place] [address] [phone]       Follow up with Kathryne Hitch, MD In 2 weeks.   Specialty:  Orthopedic Surgery   Contact information:   [address] [place] [address] [phone]       Follow up with [facility].   Why:  Home Health Physical Therapy   Contact information:   [address] [place] [address] [phone]       Follow up with Kathryne Hitch, MD In 2 weeks.    Specialty:  Orthopedic Surgery   Contact information:   [address] [place] [address] [phone]        Signed: Kathryne Hitch 04/05/2015, 8:18 AM

## 2015-04-06 ENCOUNTER — Encounter (HOSPITAL_COMMUNITY): Payer: Self-pay | Admitting: Orthopaedic Surgery

## 2015-11-16 DIAGNOSIS — F325 Major depressive disorder, single episode, in full remission: Secondary | ICD-10-CM | POA: Diagnosis not present

## 2015-11-16 DIAGNOSIS — E7800 Pure hypercholesterolemia, unspecified: Secondary | ICD-10-CM | POA: Diagnosis not present

## 2015-11-17 DIAGNOSIS — F330 Major depressive disorder, recurrent, mild: Secondary | ICD-10-CM | POA: Diagnosis not present

## 2015-11-23 DIAGNOSIS — I83812 Varicose veins of left lower extremities with pain: Secondary | ICD-10-CM | POA: Diagnosis not present

## 2015-11-23 DIAGNOSIS — I87322 Chronic venous hypertension (idiopathic) with inflammation of left lower extremity: Secondary | ICD-10-CM | POA: Diagnosis not present

## 2015-11-23 DIAGNOSIS — I8312 Varicose veins of left lower extremity with inflammation: Secondary | ICD-10-CM | POA: Diagnosis not present

## 2015-11-30 DIAGNOSIS — H9042 Sensorineural hearing loss, unilateral, left ear, with unrestricted hearing on the contralateral side: Secondary | ICD-10-CM | POA: Diagnosis not present

## 2015-11-30 DIAGNOSIS — H9312 Tinnitus, left ear: Secondary | ICD-10-CM | POA: Diagnosis not present

## 2015-11-30 DIAGNOSIS — H8102 Meniere's disease, left ear: Secondary | ICD-10-CM | POA: Diagnosis not present

## 2015-12-08 DIAGNOSIS — M1611 Unilateral primary osteoarthritis, right hip: Secondary | ICD-10-CM | POA: Diagnosis not present

## 2015-12-08 DIAGNOSIS — M25551 Pain in right hip: Secondary | ICD-10-CM | POA: Diagnosis not present

## 2015-12-09 DIAGNOSIS — I8312 Varicose veins of left lower extremity with inflammation: Secondary | ICD-10-CM | POA: Diagnosis not present

## 2015-12-20 DIAGNOSIS — D225 Melanocytic nevi of trunk: Secondary | ICD-10-CM | POA: Diagnosis not present

## 2015-12-20 DIAGNOSIS — B078 Other viral warts: Secondary | ICD-10-CM | POA: Diagnosis not present

## 2015-12-20 DIAGNOSIS — L237 Allergic contact dermatitis due to plants, except food: Secondary | ICD-10-CM | POA: Diagnosis not present

## 2015-12-20 DIAGNOSIS — D485 Neoplasm of uncertain behavior of skin: Secondary | ICD-10-CM | POA: Diagnosis not present

## 2015-12-20 DIAGNOSIS — Z872 Personal history of diseases of the skin and subcutaneous tissue: Secondary | ICD-10-CM | POA: Diagnosis not present

## 2015-12-20 DIAGNOSIS — Z86018 Personal history of other benign neoplasm: Secondary | ICD-10-CM | POA: Diagnosis not present

## 2015-12-20 DIAGNOSIS — L988 Other specified disorders of the skin and subcutaneous tissue: Secondary | ICD-10-CM | POA: Diagnosis not present

## 2015-12-20 DIAGNOSIS — L570 Actinic keratosis: Secondary | ICD-10-CM | POA: Diagnosis not present

## 2016-04-20 DIAGNOSIS — E039 Hypothyroidism, unspecified: Secondary | ICD-10-CM | POA: Diagnosis not present

## 2016-04-27 DIAGNOSIS — F330 Major depressive disorder, recurrent, mild: Secondary | ICD-10-CM | POA: Diagnosis not present

## 2016-05-01 ENCOUNTER — Ambulatory Visit (INDEPENDENT_AMBULATORY_CARE_PROVIDER_SITE_OTHER): Payer: Self-pay | Admitting: Orthopaedic Surgery

## 2016-05-10 DIAGNOSIS — F330 Major depressive disorder, recurrent, mild: Secondary | ICD-10-CM | POA: Diagnosis not present

## 2016-05-17 DIAGNOSIS — E039 Hypothyroidism, unspecified: Secondary | ICD-10-CM | POA: Diagnosis not present

## 2016-05-17 DIAGNOSIS — E7800 Pure hypercholesterolemia, unspecified: Secondary | ICD-10-CM | POA: Diagnosis not present

## 2016-05-17 DIAGNOSIS — F325 Major depressive disorder, single episode, in full remission: Secondary | ICD-10-CM | POA: Diagnosis not present

## 2016-05-22 DIAGNOSIS — F330 Major depressive disorder, recurrent, mild: Secondary | ICD-10-CM | POA: Diagnosis not present

## 2016-06-02 DIAGNOSIS — F330 Major depressive disorder, recurrent, mild: Secondary | ICD-10-CM | POA: Diagnosis not present

## 2016-06-12 DIAGNOSIS — D2339 Other benign neoplasm of skin of other parts of face: Secondary | ICD-10-CM | POA: Diagnosis not present

## 2016-06-12 DIAGNOSIS — L570 Actinic keratosis: Secondary | ICD-10-CM | POA: Diagnosis not present

## 2016-06-12 DIAGNOSIS — D485 Neoplasm of uncertain behavior of skin: Secondary | ICD-10-CM | POA: Diagnosis not present

## 2016-06-16 DIAGNOSIS — F330 Major depressive disorder, recurrent, mild: Secondary | ICD-10-CM | POA: Diagnosis not present

## 2016-06-27 DIAGNOSIS — F330 Major depressive disorder, recurrent, mild: Secondary | ICD-10-CM | POA: Diagnosis not present

## 2016-06-27 DIAGNOSIS — I83812 Varicose veins of left lower extremities with pain: Secondary | ICD-10-CM | POA: Diagnosis not present

## 2016-07-13 DIAGNOSIS — F330 Major depressive disorder, recurrent, mild: Secondary | ICD-10-CM | POA: Diagnosis not present

## 2016-07-28 DIAGNOSIS — F330 Major depressive disorder, recurrent, mild: Secondary | ICD-10-CM | POA: Diagnosis not present

## 2016-08-04 DIAGNOSIS — F330 Major depressive disorder, recurrent, mild: Secondary | ICD-10-CM | POA: Diagnosis not present

## 2016-08-07 DIAGNOSIS — F330 Major depressive disorder, recurrent, mild: Secondary | ICD-10-CM | POA: Diagnosis not present

## 2016-08-10 DIAGNOSIS — F330 Major depressive disorder, recurrent, mild: Secondary | ICD-10-CM | POA: Diagnosis not present

## 2016-08-22 DIAGNOSIS — F330 Major depressive disorder, recurrent, mild: Secondary | ICD-10-CM | POA: Diagnosis not present

## 2016-09-01 DIAGNOSIS — F330 Major depressive disorder, recurrent, mild: Secondary | ICD-10-CM | POA: Diagnosis not present

## 2016-09-06 DIAGNOSIS — F330 Major depressive disorder, recurrent, mild: Secondary | ICD-10-CM | POA: Diagnosis not present

## 2016-09-11 DIAGNOSIS — F330 Major depressive disorder, recurrent, mild: Secondary | ICD-10-CM | POA: Diagnosis not present

## 2016-09-20 DIAGNOSIS — F330 Major depressive disorder, recurrent, mild: Secondary | ICD-10-CM | POA: Diagnosis not present

## 2016-09-28 DIAGNOSIS — F330 Major depressive disorder, recurrent, mild: Secondary | ICD-10-CM | POA: Diagnosis not present

## 2016-10-03 DIAGNOSIS — F330 Major depressive disorder, recurrent, mild: Secondary | ICD-10-CM | POA: Diagnosis not present

## 2016-10-16 DIAGNOSIS — D509 Iron deficiency anemia, unspecified: Secondary | ICD-10-CM | POA: Diagnosis not present

## 2016-10-16 DIAGNOSIS — E039 Hypothyroidism, unspecified: Secondary | ICD-10-CM | POA: Diagnosis not present

## 2016-10-20 DIAGNOSIS — F330 Major depressive disorder, recurrent, mild: Secondary | ICD-10-CM | POA: Diagnosis not present

## 2016-10-23 DIAGNOSIS — E039 Hypothyroidism, unspecified: Secondary | ICD-10-CM | POA: Diagnosis not present

## 2016-10-23 DIAGNOSIS — F330 Major depressive disorder, recurrent, mild: Secondary | ICD-10-CM | POA: Diagnosis not present

## 2016-11-06 DIAGNOSIS — F330 Major depressive disorder, recurrent, mild: Secondary | ICD-10-CM | POA: Diagnosis not present

## 2016-11-20 DIAGNOSIS — F330 Major depressive disorder, recurrent, mild: Secondary | ICD-10-CM | POA: Diagnosis not present

## 2016-11-27 DIAGNOSIS — F330 Major depressive disorder, recurrent, mild: Secondary | ICD-10-CM | POA: Diagnosis not present

## 2017-01-02 DIAGNOSIS — F330 Major depressive disorder, recurrent, mild: Secondary | ICD-10-CM | POA: Diagnosis not present

## 2017-01-02 DIAGNOSIS — F325 Major depressive disorder, single episode, in full remission: Secondary | ICD-10-CM | POA: Diagnosis not present

## 2017-01-02 DIAGNOSIS — E039 Hypothyroidism, unspecified: Secondary | ICD-10-CM | POA: Diagnosis not present

## 2017-01-02 DIAGNOSIS — E7800 Pure hypercholesterolemia, unspecified: Secondary | ICD-10-CM | POA: Diagnosis not present

## 2017-01-16 DIAGNOSIS — F330 Major depressive disorder, recurrent, mild: Secondary | ICD-10-CM | POA: Diagnosis not present

## 2017-01-18 DIAGNOSIS — Z86018 Personal history of other benign neoplasm: Secondary | ICD-10-CM | POA: Diagnosis not present

## 2017-01-18 DIAGNOSIS — L814 Other melanin hyperpigmentation: Secondary | ICD-10-CM | POA: Diagnosis not present

## 2017-01-18 DIAGNOSIS — D1801 Hemangioma of skin and subcutaneous tissue: Secondary | ICD-10-CM | POA: Diagnosis not present

## 2017-01-18 DIAGNOSIS — D225 Melanocytic nevi of trunk: Secondary | ICD-10-CM | POA: Diagnosis not present

## 2017-02-02 DIAGNOSIS — F330 Major depressive disorder, recurrent, mild: Secondary | ICD-10-CM | POA: Diagnosis not present

## 2017-03-08 DIAGNOSIS — F330 Major depressive disorder, recurrent, mild: Secondary | ICD-10-CM | POA: Diagnosis not present

## 2017-03-13 DIAGNOSIS — D230 Other benign neoplasm of skin of lip: Secondary | ICD-10-CM | POA: Diagnosis not present

## 2017-03-27 DIAGNOSIS — D230 Other benign neoplasm of skin of lip: Secondary | ICD-10-CM | POA: Diagnosis not present

## 2017-04-03 DIAGNOSIS — G959 Disease of spinal cord, unspecified: Secondary | ICD-10-CM | POA: Diagnosis not present

## 2017-04-03 DIAGNOSIS — M5410 Radiculopathy, site unspecified: Secondary | ICD-10-CM | POA: Diagnosis not present

## 2017-04-05 DIAGNOSIS — L570 Actinic keratosis: Secondary | ICD-10-CM | POA: Diagnosis not present

## 2017-04-09 DIAGNOSIS — F330 Major depressive disorder, recurrent, mild: Secondary | ICD-10-CM | POA: Diagnosis not present

## 2017-04-23 DIAGNOSIS — F330 Major depressive disorder, recurrent, mild: Secondary | ICD-10-CM | POA: Diagnosis not present

## 2017-04-25 DIAGNOSIS — E039 Hypothyroidism, unspecified: Secondary | ICD-10-CM | POA: Diagnosis not present

## 2017-05-07 DIAGNOSIS — F330 Major depressive disorder, recurrent, mild: Secondary | ICD-10-CM | POA: Diagnosis not present

## 2017-05-16 DIAGNOSIS — F330 Major depressive disorder, recurrent, mild: Secondary | ICD-10-CM | POA: Diagnosis not present

## 2017-05-29 DIAGNOSIS — F330 Major depressive disorder, recurrent, mild: Secondary | ICD-10-CM | POA: Diagnosis not present

## 2017-07-02 DIAGNOSIS — E039 Hypothyroidism, unspecified: Secondary | ICD-10-CM | POA: Diagnosis not present

## 2017-07-04 DIAGNOSIS — F325 Major depressive disorder, single episode, in full remission: Secondary | ICD-10-CM | POA: Diagnosis not present

## 2017-07-04 DIAGNOSIS — E7800 Pure hypercholesterolemia, unspecified: Secondary | ICD-10-CM | POA: Diagnosis not present

## 2017-07-04 DIAGNOSIS — E039 Hypothyroidism, unspecified: Secondary | ICD-10-CM | POA: Diagnosis not present

## 2017-07-17 DIAGNOSIS — H9042 Sensorineural hearing loss, unilateral, left ear, with unrestricted hearing on the contralateral side: Secondary | ICD-10-CM | POA: Diagnosis not present

## 2017-07-17 DIAGNOSIS — H8102 Meniere's disease, left ear: Secondary | ICD-10-CM | POA: Diagnosis not present

## 2017-07-17 DIAGNOSIS — G4489 Other headache syndrome: Secondary | ICD-10-CM | POA: Diagnosis not present

## 2017-07-17 DIAGNOSIS — H9312 Tinnitus, left ear: Secondary | ICD-10-CM | POA: Diagnosis not present

## 2017-07-23 ENCOUNTER — Other Ambulatory Visit: Payer: Self-pay | Admitting: Otolaryngology

## 2017-07-23 DIAGNOSIS — G4489 Other headache syndrome: Secondary | ICD-10-CM

## 2017-07-23 DIAGNOSIS — H8102 Meniere's disease, left ear: Secondary | ICD-10-CM

## 2017-07-23 DIAGNOSIS — IMO0001 Reserved for inherently not codable concepts without codable children: Secondary | ICD-10-CM

## 2017-07-23 DIAGNOSIS — H9042 Sensorineural hearing loss, unilateral, left ear, with unrestricted hearing on the contralateral side: Secondary | ICD-10-CM

## 2017-07-23 DIAGNOSIS — H9312 Tinnitus, left ear: Secondary | ICD-10-CM

## 2017-08-02 ENCOUNTER — Ambulatory Visit
Admission: RE | Admit: 2017-08-02 | Discharge: 2017-08-02 | Disposition: A | Discharge status: Home / Self Care | Payer: BLUE CROSS/BLUE SHIELD | Source: Ambulatory Visit | Attending: Otolaryngology | Admitting: Otolaryngology

## 2017-08-02 DIAGNOSIS — IMO0001 Reserved for inherently not codable concepts without codable children: Secondary | ICD-10-CM

## 2017-08-02 DIAGNOSIS — H9042 Sensorineural hearing loss, unilateral, left ear, with unrestricted hearing on the contralateral side: Secondary | ICD-10-CM

## 2017-08-02 DIAGNOSIS — G4489 Other headache syndrome: Secondary | ICD-10-CM

## 2017-08-02 DIAGNOSIS — J329 Chronic sinusitis, unspecified: Secondary | ICD-10-CM | POA: Diagnosis not present

## 2017-08-02 DIAGNOSIS — H9312 Tinnitus, left ear: Secondary | ICD-10-CM

## 2017-08-02 DIAGNOSIS — H8102 Meniere's disease, left ear: Secondary | ICD-10-CM

## 2017-08-09 DIAGNOSIS — Z01419 Encounter for gynecological examination (general) (routine) without abnormal findings: Secondary | ICD-10-CM | POA: Diagnosis not present

## 2017-08-09 DIAGNOSIS — Z1231 Encounter for screening mammogram for malignant neoplasm of breast: Secondary | ICD-10-CM | POA: Diagnosis not present

## 2017-08-09 DIAGNOSIS — Z6828 Body mass index (BMI) 28.0-28.9, adult: Secondary | ICD-10-CM | POA: Diagnosis not present

## 2017-08-21 DIAGNOSIS — H9042 Sensorineural hearing loss, unilateral, left ear, with unrestricted hearing on the contralateral side: Secondary | ICD-10-CM | POA: Diagnosis not present

## 2017-08-21 DIAGNOSIS — G4489 Other headache syndrome: Secondary | ICD-10-CM | POA: Diagnosis not present

## 2017-08-21 DIAGNOSIS — H9312 Tinnitus, left ear: Secondary | ICD-10-CM | POA: Diagnosis not present

## 2017-08-21 DIAGNOSIS — H8102 Meniere's disease, left ear: Secondary | ICD-10-CM | POA: Diagnosis not present

## 2017-08-27 DIAGNOSIS — F330 Major depressive disorder, recurrent, mild: Secondary | ICD-10-CM | POA: Diagnosis not present

## 2017-09-07 DIAGNOSIS — H9312 Tinnitus, left ear: Secondary | ICD-10-CM | POA: Diagnosis not present

## 2017-09-07 DIAGNOSIS — H9042 Sensorineural hearing loss, unilateral, left ear, with unrestricted hearing on the contralateral side: Secondary | ICD-10-CM | POA: Diagnosis not present

## 2017-09-07 DIAGNOSIS — G4489 Other headache syndrome: Secondary | ICD-10-CM | POA: Diagnosis not present

## 2017-09-07 DIAGNOSIS — H8102 Meniere's disease, left ear: Secondary | ICD-10-CM | POA: Diagnosis not present

## 2017-09-13 DIAGNOSIS — L820 Inflamed seborrheic keratosis: Secondary | ICD-10-CM | POA: Diagnosis not present

## 2017-09-13 DIAGNOSIS — D2271 Melanocytic nevi of right lower limb, including hip: Secondary | ICD-10-CM | POA: Diagnosis not present

## 2017-09-13 DIAGNOSIS — D2272 Melanocytic nevi of left lower limb, including hip: Secondary | ICD-10-CM | POA: Diagnosis not present

## 2017-09-13 DIAGNOSIS — D225 Melanocytic nevi of trunk: Secondary | ICD-10-CM | POA: Diagnosis not present

## 2017-09-13 DIAGNOSIS — D2262 Melanocytic nevi of left upper limb, including shoulder: Secondary | ICD-10-CM | POA: Diagnosis not present

## 2017-09-26 DIAGNOSIS — Z1211 Encounter for screening for malignant neoplasm of colon: Secondary | ICD-10-CM | POA: Diagnosis not present

## 2017-09-26 DIAGNOSIS — K640 First degree hemorrhoids: Secondary | ICD-10-CM | POA: Diagnosis not present

## 2017-10-16 DIAGNOSIS — E039 Hypothyroidism, unspecified: Secondary | ICD-10-CM | POA: Diagnosis not present

## 2017-10-23 DIAGNOSIS — E039 Hypothyroidism, unspecified: Secondary | ICD-10-CM | POA: Diagnosis not present

## 2017-11-21 DIAGNOSIS — J22 Unspecified acute lower respiratory infection: Secondary | ICD-10-CM | POA: Diagnosis not present

## 2018-01-23 DIAGNOSIS — F325 Major depressive disorder, single episode, in full remission: Secondary | ICD-10-CM | POA: Diagnosis not present

## 2018-01-23 DIAGNOSIS — E039 Hypothyroidism, unspecified: Secondary | ICD-10-CM | POA: Diagnosis not present

## 2018-01-23 DIAGNOSIS — E7800 Pure hypercholesterolemia, unspecified: Secondary | ICD-10-CM | POA: Diagnosis not present

## 2018-03-15 ENCOUNTER — Ambulatory Visit: Payer: BLUE CROSS/BLUE SHIELD | Admitting: Family Medicine

## 2018-03-15 ENCOUNTER — Encounter: Payer: Self-pay | Admitting: Family Medicine

## 2018-03-15 VITALS — BP 135/85 | HR 76 | Ht 68.0 in | Wt 170.0 lb

## 2018-03-15 DIAGNOSIS — M25521 Pain in right elbow: Secondary | ICD-10-CM | POA: Diagnosis not present

## 2018-03-15 NOTE — Patient Instructions (Signed)
You have lateral epicondylitis Try to avoid painful activities as much as possible. Ice the area 3-4 times a day for 15 minutes at a time. Tylenol or aleve as needed for pain. Counterforce brace as directed can help unload area - wear this regularly if it provides you with relief. Hammer rotation exercise, wrist extension exercise with 1 pound weight - 3 sets of 10 once a day.   Stretching - hold for 20-30 seconds and repeat 3 times. Consider physical therapy, nitro patches if not improving. Follow up in 6 weeks.   

## 2018-03-18 ENCOUNTER — Encounter: Payer: Self-pay | Admitting: Family Medicine

## 2018-03-18 NOTE — Progress Notes (Signed)
PCP: Sara Santiago, Candace, MD  Subjective:   HPI: Patient is a 62 y.o. female here for right elbow pain.  Patient reports about 3-4 weeks ago she developed lateral right elbow pain that came on suddenly. Pain level 1/10 but up to 6/10 and sharp at times. Pain with straightening and opening things. Tried ibuprofen without benefit. Massage, CBD has helped some. Right handed. No skin changes, numbness.  Past Medical History:  Diagnosis Date  . Arthritis   . Complication of anesthesia   . Depression   . Hypothyroidism   . Meniere's disease of left ear    uses Triamterene-Hydrochlorothizide for this  . PONV (postoperative nausea and vomiting)     Current Outpatient Medications on File Prior to Visit  Medication Sig Dispense Refill  . aspirin EC 325 MG EC tablet Take 1 tablet (325 mg total) by mouth 2 (two) times daily after a meal. 30 tablet 0  . atorvastatin (LIPITOR) 20 MG tablet Take by mouth.    Marland Kitchen. CALCIUM PO Take 1 each by mouth every morning.    . carbamazepine (TEGRETOL) 200 MG tablet Take 1 tablet by mouth 2 (two) times daily.  1  . levothyroxine (SYNTHROID, LEVOTHROID) 137 MCG tablet TK 1 T PO QD  4  . Multiple Vitamins-Minerals (MULTI ADULT GUMMIES PO) Take 1 each by mouth every morning.    . phentermine (ADIPEX-P) 37.5 MG tablet TK 1 T PO QD  0  . sertraline (ZOLOFT) 100 MG tablet Take 100 mg by mouth at bedtime.     . simvastatin (ZOCOR) 20 MG tablet Take 20 mg by mouth at bedtime.    . triamterene-hydrochlorothiazide (DYAZIDE) 37.5-25 MG per capsule Take 1 capsule by mouth every morning.     No current facility-administered medications on file prior to visit.     Past Surgical History:  Procedure Laterality Date  . ABDOMINAL HYSTERECTOMY    . cervical fusion      C5-6, caused pain post-op so now on Tegretol  . LASER ABLATION     on retina-right eye  . TONSILLECTOMY     as child  . TOTAL HIP ARTHROPLASTY Right 04/02/2015   Procedure: RIGHT TOTAL HIP ARTHROPLASTY  ANTERIOR APPROACH;  Surgeon: Kathryne Hitchhristopher Y Blackman, MD;  Location: [facility];  Service: Orthopedics;  Laterality: Right;    No Known Allergies  Social History   Socioeconomic History  . Marital status: Married    Spouse name: Not on file  . Number of children: Not on file  . Years of education: Not on file  . Highest education level: Not on file  Occupational History  . Not on file  Social Needs  . Financial resource strain: Not on file  . Food insecurity:    Worry: Not on file    Inability: Not on file  . Transportation needs:    Medical: Not on file    Non-medical: Not on file  Tobacco Use  . Smoking status: Never Smoker  . Smokeless tobacco: Never Used  Substance and Sexual Activity  . Alcohol use: Yes    Comment: occassionally  . Drug use: No  . Sexual activity: Not on file  Lifestyle  . Physical activity:    Days per week: Not on file    Minutes per session: Not on file  . Stress: Not on file  Relationships  . Social connections:    Talks on phone: Not on file    Gets together: Not on file    Attends religious  service: Not on file    Active member of club or organization: Not on file    Attends meetings of clubs or organizations: Not on file    Relationship status: Not on file  . Intimate partner violence:    Fear of current or ex partner: Not on file    Emotionally abused: Not on file    Physically abused: Not on file    Forced sexual activity: Not on file  Other Topics Concern  . Not on file  Social History Narrative  . Not on file    History reviewed. No pertinent family history.  BP 135/85   Pulse 76   Ht 5\' 8"  (1.727 m)   Wt 170 lb (77.1 kg)   BMI 25.85 kg/m   Review of Systems: See HPI above.     Objective:  Physical Exam:  Gen: NAD, comfortable in exam room  Right elbow: No deformity, swelling, bruising. FROM with 5/5 strength but pain on extension of wrist and 3rd digit.  No pain supination. TTP lateral epicondyle.   NVI  distally. Collateral ligaments intact. Negative tinels cubital tunnel.  Left elbow: No deformity. FROM with 5/5 strength. No tenderness to palpation. NVI distally.   Assessment & Plan:  1. Right elbow pain - 2/2 lateral epicondylitis.  Shown home exercises to do daily and stretches.  Icing, tylenol or aleve.  Counterforce brace.  Consider PT, nitro patches if not improving.  F/u in 6 weeks.

## 2018-05-09 DIAGNOSIS — R7301 Impaired fasting glucose: Secondary | ICD-10-CM | POA: Diagnosis not present

## 2018-05-09 DIAGNOSIS — E039 Hypothyroidism, unspecified: Secondary | ICD-10-CM | POA: Diagnosis not present

## 2018-05-16 DIAGNOSIS — E039 Hypothyroidism, unspecified: Secondary | ICD-10-CM | POA: Diagnosis not present

## 2018-05-16 DIAGNOSIS — R7301 Impaired fasting glucose: Secondary | ICD-10-CM | POA: Diagnosis not present

## 2018-06-06 IMAGING — CT CT MAXILLOFACIAL W/O CM
1 series · 16 of 30 positions shown, 20 images · non-contrast
Comparison: None.

CLINICAL DATA: Headaches and sinusitis.

EXAM:
CT MAXILLOFACIAL WITHOUT CONTRAST
TECHNIQUE: Multidetector CT images of the paranasal sinuses were obtained using
the standard protocol without intravenous contrast.

[Series 4: maxofacial soft · axial · 0.37mm/px · z∈[-178,-66]mm · 16 of 41 slices shown, 20 images]
[im 2/41  brain]
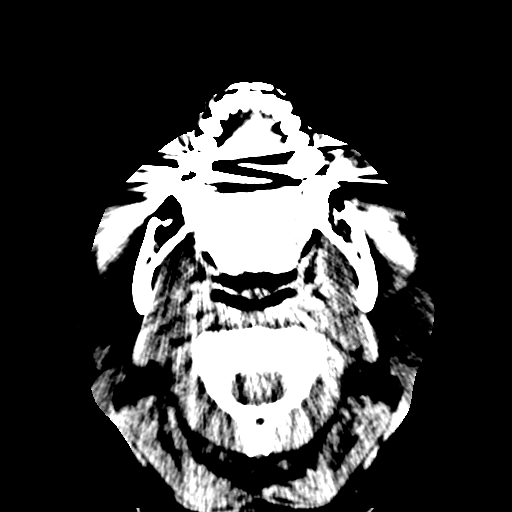
[im 2/41  bone]
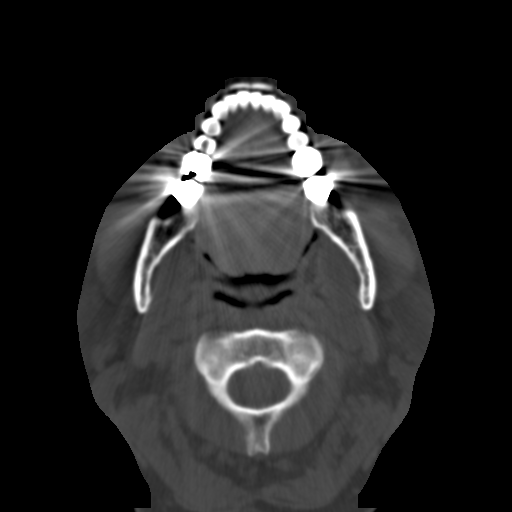
[im 5/41  bone]
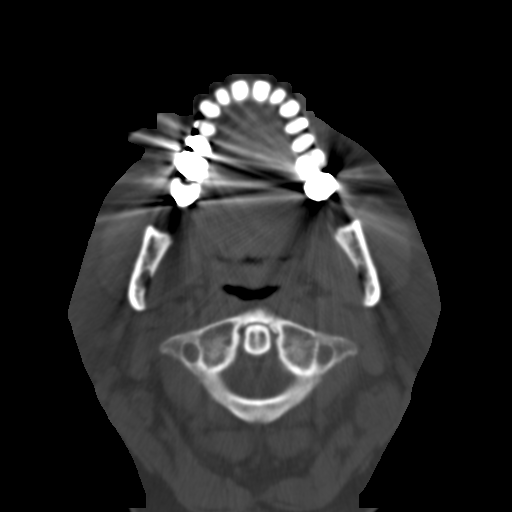
[im 7/41  bone]
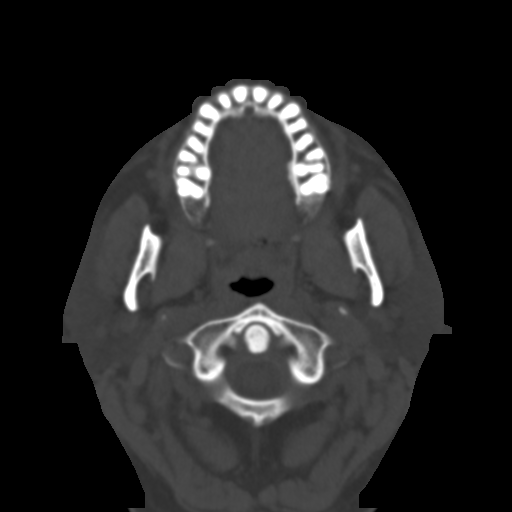
[im 10/41  bone]
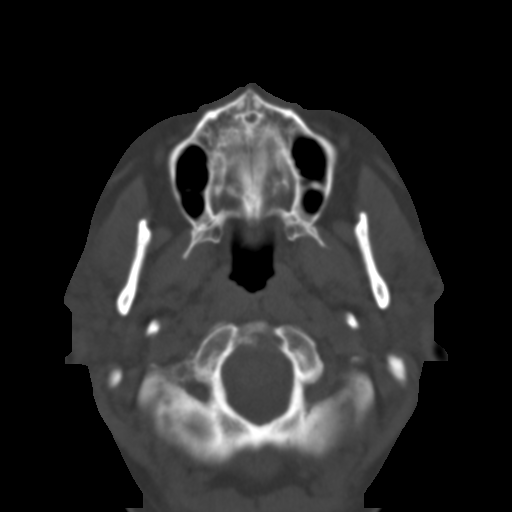
[im 12/41  brain]
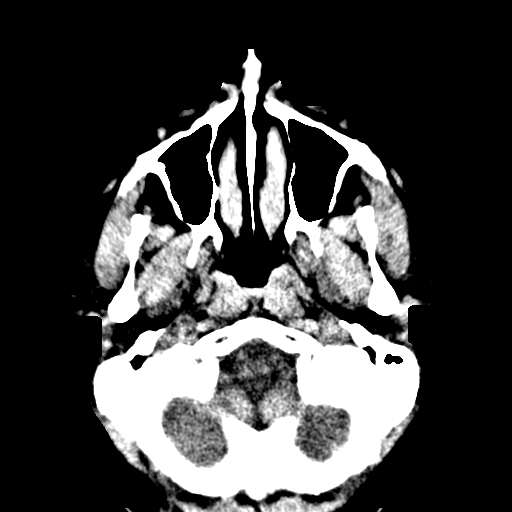
[im 12/41  bone]
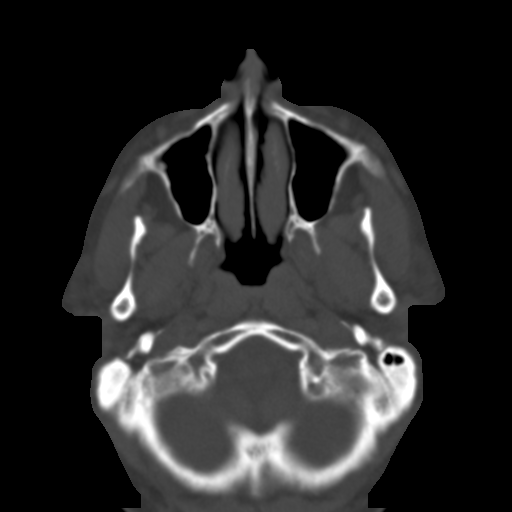
[im 14/41  bone]
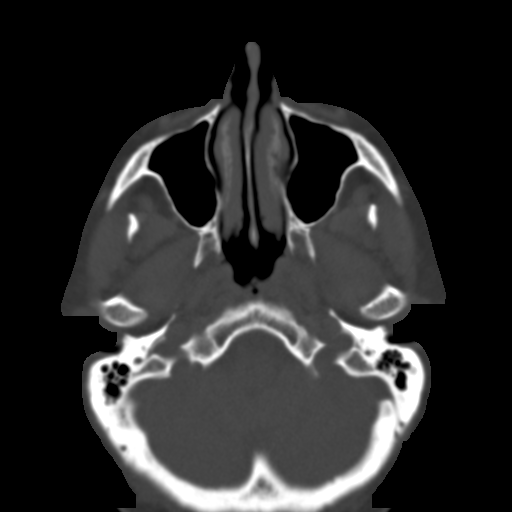
[im 17/41  bone]
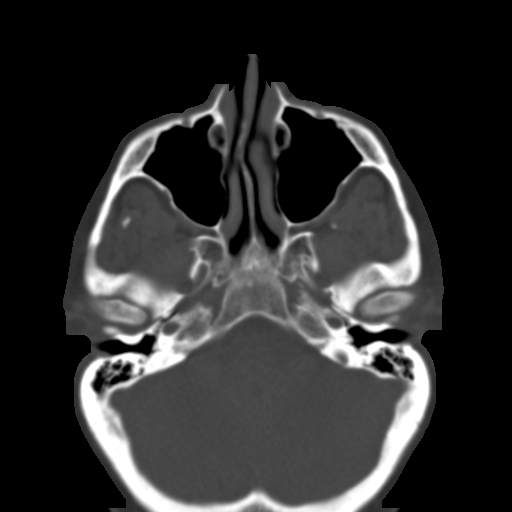
[im 20/41  bone]
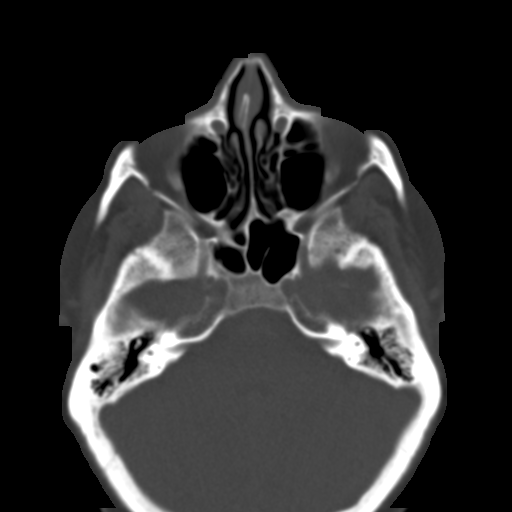
[im 21/41  brain]
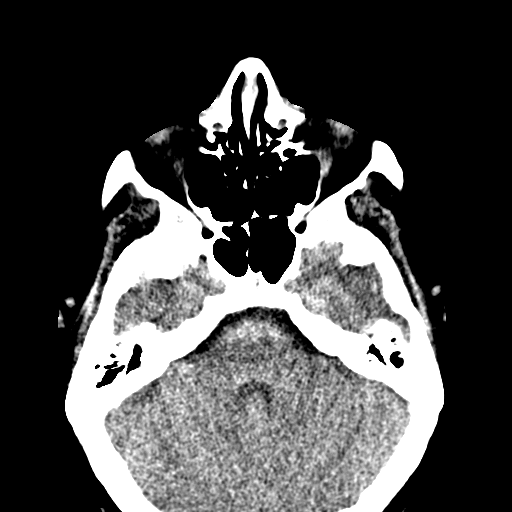
[im 21/41  bone]
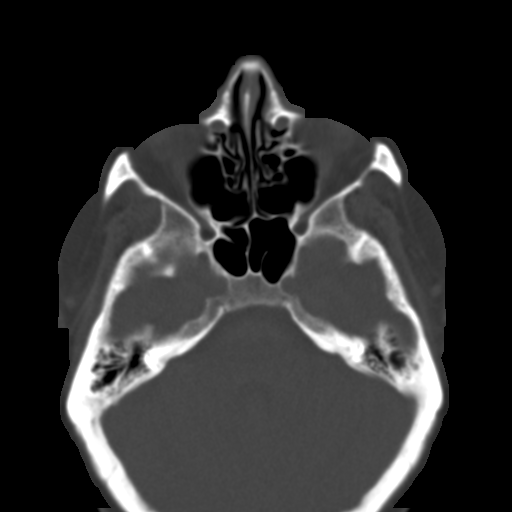
[im 24/41  bone]
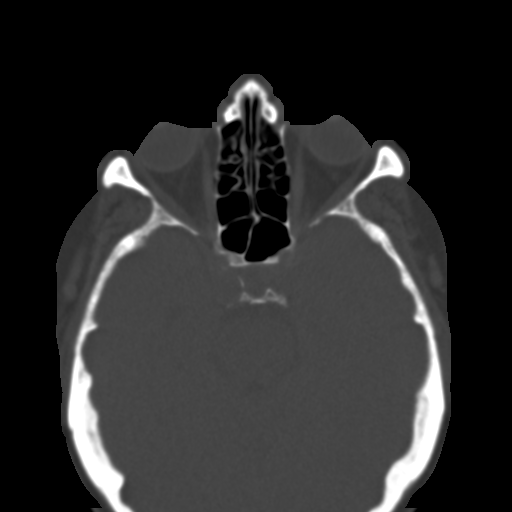
[im 27/41  bone]
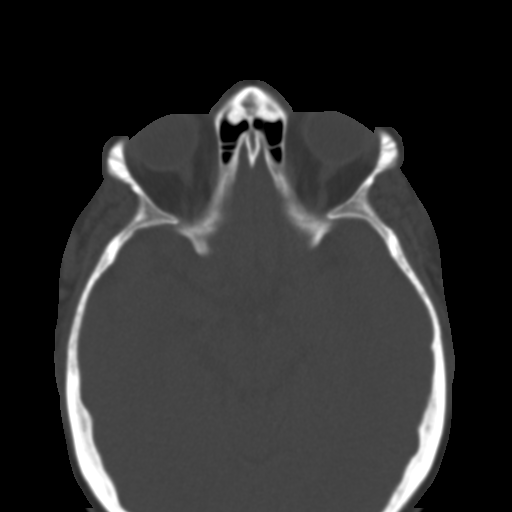
[im 29/41  bone]
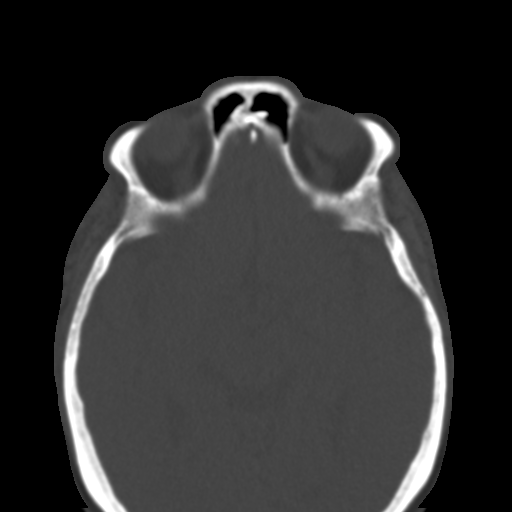
[im 31/41  brain]
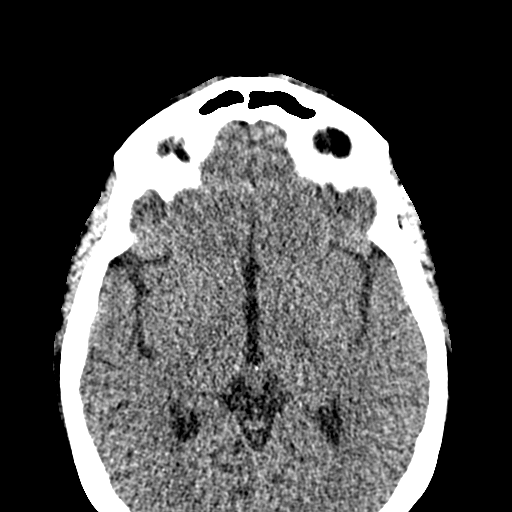
[im 31/41  bone]
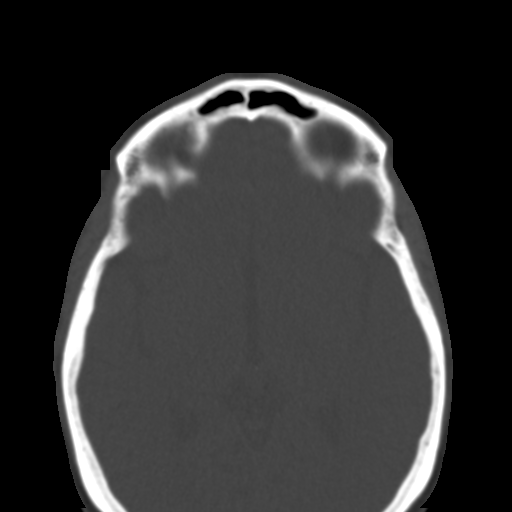
[im 34/41  bone]
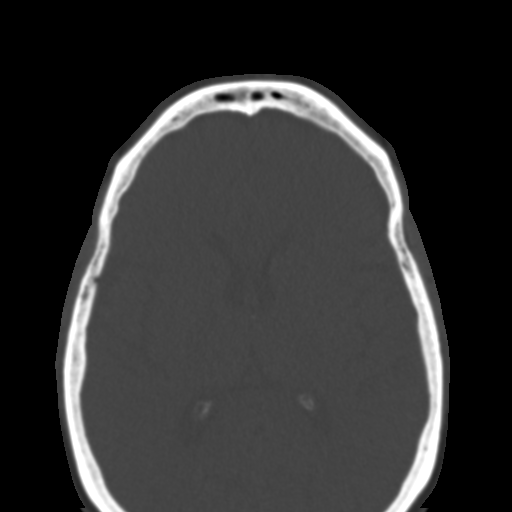
[im 36/41  bone]
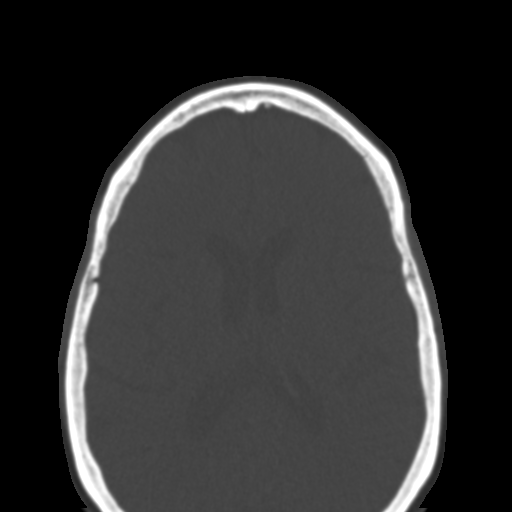
[im 39/41  bone]
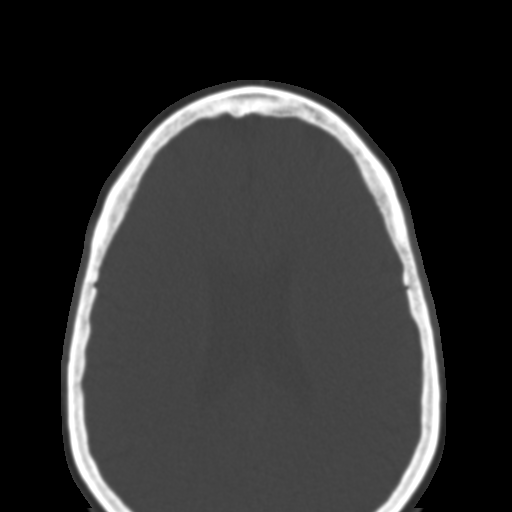

[16 of 30 positions shown; findings below may reference images not displayed]

FINDINGS: Paranasal sinuses:

Frontal: Normally aerated. Patent frontal sinus drainage pathways.

Ethmoid: Essentially clear.

Maxillary: Normally aerated.

Sphenoid: Normally aerated. Patent sphenoethmoidal recesses.

Right ostiomeatal unit: Patent.

Left ostiomeatal unit: Patent.

Nasal passages: Patent. Intact nasal septum deviates LEFT-to-RIGHT 2
mm.

Anatomy: No pneumatization superior to anterior ethmoid notches.
Symmetric and intact olfactory grooves and fovea ethmoidalis, Keros
II (4-7mm) Presellar sphenoid pneumatization pattern. No dehiscence
of carotid or optic canals. No onodi cell.

Other: Orbits and intracranial compartment are unremarkable. Visible
mastoid air cells are normally aerated.
IMPRESSION: Normally aerated paranasal sinuses.  Patent sinus drainage pathways.

## 2018-06-18 DIAGNOSIS — H9312 Tinnitus, left ear: Secondary | ICD-10-CM | POA: Diagnosis not present

## 2018-06-18 DIAGNOSIS — H8102 Meniere's disease, left ear: Secondary | ICD-10-CM | POA: Diagnosis not present

## 2018-06-18 DIAGNOSIS — H9042 Sensorineural hearing loss, unilateral, left ear, with unrestricted hearing on the contralateral side: Secondary | ICD-10-CM | POA: Diagnosis not present

## 2018-06-18 DIAGNOSIS — G4489 Other headache syndrome: Secondary | ICD-10-CM | POA: Diagnosis not present

## 2018-07-01 DIAGNOSIS — E039 Hypothyroidism, unspecified: Secondary | ICD-10-CM | POA: Diagnosis not present

## 2018-08-19 DIAGNOSIS — H9312 Tinnitus, left ear: Secondary | ICD-10-CM | POA: Diagnosis not present

## 2018-08-19 DIAGNOSIS — H9042 Sensorineural hearing loss, unilateral, left ear, with unrestricted hearing on the contralateral side: Secondary | ICD-10-CM | POA: Diagnosis not present

## 2018-08-19 DIAGNOSIS — G4489 Other headache syndrome: Secondary | ICD-10-CM | POA: Diagnosis not present

## 2018-08-19 DIAGNOSIS — H8102 Meniere's disease, left ear: Secondary | ICD-10-CM | POA: Diagnosis not present

## 2018-08-28 DIAGNOSIS — E7800 Pure hypercholesterolemia, unspecified: Secondary | ICD-10-CM | POA: Diagnosis not present

## 2018-08-28 DIAGNOSIS — E039 Hypothyroidism, unspecified: Secondary | ICD-10-CM | POA: Diagnosis not present

## 2018-08-28 DIAGNOSIS — F325 Major depressive disorder, single episode, in full remission: Secondary | ICD-10-CM | POA: Diagnosis not present

## 2018-09-04 DIAGNOSIS — H16143 Punctate keratitis, bilateral: Secondary | ICD-10-CM | POA: Diagnosis not present

## 2018-09-06 DIAGNOSIS — H16143 Punctate keratitis, bilateral: Secondary | ICD-10-CM | POA: Diagnosis not present

## 2019-01-14 DIAGNOSIS — Z1231 Encounter for screening mammogram for malignant neoplasm of breast: Secondary | ICD-10-CM | POA: Diagnosis not present

## 2019-01-14 DIAGNOSIS — Z6828 Body mass index (BMI) 28.0-28.9, adult: Secondary | ICD-10-CM | POA: Diagnosis not present

## 2019-01-14 DIAGNOSIS — Z13820 Encounter for screening for osteoporosis: Secondary | ICD-10-CM | POA: Diagnosis not present

## 2019-01-14 DIAGNOSIS — Z01419 Encounter for gynecological examination (general) (routine) without abnormal findings: Secondary | ICD-10-CM | POA: Diagnosis not present

## 2019-02-26 DIAGNOSIS — E7800 Pure hypercholesterolemia, unspecified: Secondary | ICD-10-CM | POA: Diagnosis not present

## 2019-02-26 DIAGNOSIS — F325 Major depressive disorder, single episode, in full remission: Secondary | ICD-10-CM | POA: Diagnosis not present

## 2019-04-24 DIAGNOSIS — E039 Hypothyroidism, unspecified: Secondary | ICD-10-CM | POA: Diagnosis not present

## 2019-04-24 DIAGNOSIS — R7301 Impaired fasting glucose: Secondary | ICD-10-CM | POA: Diagnosis not present

## 2019-05-28 DIAGNOSIS — R42 Dizziness and giddiness: Secondary | ICD-10-CM | POA: Diagnosis not present

## 2019-05-28 DIAGNOSIS — H8102 Meniere's disease, left ear: Secondary | ICD-10-CM | POA: Diagnosis not present

## 2019-05-28 DIAGNOSIS — H9042 Sensorineural hearing loss, unilateral, left ear, with unrestricted hearing on the contralateral side: Secondary | ICD-10-CM | POA: Diagnosis not present

## 2019-06-04 DIAGNOSIS — R7301 Impaired fasting glucose: Secondary | ICD-10-CM | POA: Diagnosis not present

## 2019-06-04 DIAGNOSIS — E039 Hypothyroidism, unspecified: Secondary | ICD-10-CM | POA: Diagnosis not present

## 2019-07-18 DIAGNOSIS — H9042 Sensorineural hearing loss, unilateral, left ear, with unrestricted hearing on the contralateral side: Secondary | ICD-10-CM | POA: Diagnosis not present

## 2019-07-18 DIAGNOSIS — H8102 Meniere's disease, left ear: Secondary | ICD-10-CM | POA: Diagnosis not present

## 2019-09-24 DIAGNOSIS — R7301 Impaired fasting glucose: Secondary | ICD-10-CM | POA: Diagnosis not present

## 2019-09-24 DIAGNOSIS — E039 Hypothyroidism, unspecified: Secondary | ICD-10-CM | POA: Diagnosis not present

## 2019-09-30 DIAGNOSIS — E039 Hypothyroidism, unspecified: Secondary | ICD-10-CM | POA: Diagnosis not present

## 2019-09-30 DIAGNOSIS — H8109 Meniere's disease, unspecified ear: Secondary | ICD-10-CM | POA: Diagnosis not present

## 2019-09-30 DIAGNOSIS — R7301 Impaired fasting glucose: Secondary | ICD-10-CM | POA: Diagnosis not present

## 2019-09-30 DIAGNOSIS — E785 Hyperlipidemia, unspecified: Secondary | ICD-10-CM | POA: Diagnosis not present

## 2019-12-30 DIAGNOSIS — E039 Hypothyroidism, unspecified: Secondary | ICD-10-CM | POA: Diagnosis not present

## 2019-12-30 DIAGNOSIS — R7301 Impaired fasting glucose: Secondary | ICD-10-CM | POA: Diagnosis not present

## 2020-02-03 DIAGNOSIS — H9042 Sensorineural hearing loss, unilateral, left ear, with unrestricted hearing on the contralateral side: Secondary | ICD-10-CM | POA: Diagnosis not present

## 2020-02-03 DIAGNOSIS — H8102 Meniere's disease, left ear: Secondary | ICD-10-CM | POA: Diagnosis not present

## 2020-02-24 DIAGNOSIS — E7800 Pure hypercholesterolemia, unspecified: Secondary | ICD-10-CM | POA: Diagnosis not present

## 2020-02-24 DIAGNOSIS — F325 Major depressive disorder, single episode, in full remission: Secondary | ICD-10-CM | POA: Diagnosis not present

## 2020-02-24 DIAGNOSIS — E039 Hypothyroidism, unspecified: Secondary | ICD-10-CM | POA: Diagnosis not present

## 2020-03-11 DIAGNOSIS — L570 Actinic keratosis: Secondary | ICD-10-CM | POA: Diagnosis not present

## 2020-03-11 DIAGNOSIS — Z01419 Encounter for gynecological examination (general) (routine) without abnormal findings: Secondary | ICD-10-CM | POA: Diagnosis not present

## 2020-03-11 DIAGNOSIS — Z1231 Encounter for screening mammogram for malignant neoplasm of breast: Secondary | ICD-10-CM | POA: Diagnosis not present

## 2020-03-11 DIAGNOSIS — D225 Melanocytic nevi of trunk: Secondary | ICD-10-CM | POA: Diagnosis not present

## 2020-03-11 DIAGNOSIS — Z6824 Body mass index (BMI) 24.0-24.9, adult: Secondary | ICD-10-CM | POA: Diagnosis not present

## 2020-04-01 DIAGNOSIS — E039 Hypothyroidism, unspecified: Secondary | ICD-10-CM | POA: Diagnosis not present

## 2020-04-01 DIAGNOSIS — R7301 Impaired fasting glucose: Secondary | ICD-10-CM | POA: Diagnosis not present

## 2020-04-01 DIAGNOSIS — E785 Hyperlipidemia, unspecified: Secondary | ICD-10-CM | POA: Diagnosis not present

## 2020-04-06 DIAGNOSIS — E039 Hypothyroidism, unspecified: Secondary | ICD-10-CM | POA: Diagnosis not present

## 2020-04-06 DIAGNOSIS — H8109 Meniere's disease, unspecified ear: Secondary | ICD-10-CM | POA: Diagnosis not present

## 2020-04-06 DIAGNOSIS — R7301 Impaired fasting glucose: Secondary | ICD-10-CM | POA: Diagnosis not present

## 2020-04-06 DIAGNOSIS — E785 Hyperlipidemia, unspecified: Secondary | ICD-10-CM | POA: Diagnosis not present

## 2020-05-04 DIAGNOSIS — E039 Hypothyroidism, unspecified: Secondary | ICD-10-CM | POA: Diagnosis not present

## 2020-06-17 DIAGNOSIS — E039 Hypothyroidism, unspecified: Secondary | ICD-10-CM | POA: Diagnosis not present

## 2020-07-29 DIAGNOSIS — Z20822 Contact with and (suspected) exposure to covid-19: Secondary | ICD-10-CM | POA: Diagnosis not present

## 2020-07-29 DIAGNOSIS — Z20828 Contact with and (suspected) exposure to other viral communicable diseases: Secondary | ICD-10-CM | POA: Diagnosis not present

## 2020-08-02 DIAGNOSIS — J0100 Acute maxillary sinusitis, unspecified: Secondary | ICD-10-CM | POA: Diagnosis not present

## 2020-08-04 DIAGNOSIS — H9042 Sensorineural hearing loss, unilateral, left ear, with unrestricted hearing on the contralateral side: Secondary | ICD-10-CM | POA: Diagnosis not present

## 2020-08-04 DIAGNOSIS — H8102 Meniere's disease, left ear: Secondary | ICD-10-CM | POA: Diagnosis not present

## 2020-08-17 DIAGNOSIS — E039 Hypothyroidism, unspecified: Secondary | ICD-10-CM | POA: Diagnosis not present

## 2020-09-29 DIAGNOSIS — E7800 Pure hypercholesterolemia, unspecified: Secondary | ICD-10-CM | POA: Diagnosis not present

## 2020-09-29 DIAGNOSIS — E039 Hypothyroidism, unspecified: Secondary | ICD-10-CM | POA: Diagnosis not present

## 2020-09-29 DIAGNOSIS — F325 Major depressive disorder, single episode, in full remission: Secondary | ICD-10-CM | POA: Diagnosis not present

## 2020-11-15 ENCOUNTER — Other Ambulatory Visit (HOSPITAL_BASED_OUTPATIENT_CLINIC_OR_DEPARTMENT_OTHER): Payer: Self-pay

## 2020-11-15 ENCOUNTER — Ambulatory Visit: Payer: BC Managed Care – PPO | Attending: Internal Medicine

## 2020-11-15 DIAGNOSIS — Z23 Encounter for immunization: Secondary | ICD-10-CM

## 2020-11-15 MED ORDER — COVID-19 MRNA VACCINE (PFIZER) 30 MCG/0.3ML IM SUSP
INTRAMUSCULAR | 0 refills | Status: AC
  Filled 2020-11-15: qty 0.3, 1d supply, fill #0

## 2020-11-15 NOTE — Progress Notes (Signed)
   Covid-19 Vaccination Clinic  Name:  Sara Santiago    MRN: [id number] DOB: 1955/09/23  11/15/2020  Sara Santiago was observed post Covid-19 immunization for 15 minutes without incident. She was provided with Vaccine Information Sheet and instruction to access the V-Safe system.   Sara Santiago was instructed to call 911 with any severe reactions post vaccine: Marland Kitchen Difficulty breathing  . Swelling of face and throat  . A fast heartbeat  . A bad rash all over body  . Dizziness and weakness   Immunizations Administered    Name Date Dose VIS Date Route   PFIZER Comrnaty(Gray TOP) Covid-19 Vaccine 11/15/2020 11:33 AM 0.3 mL 07/08/2020 Intramuscular   Manufacturer: [employer], [employer]   Lot: [id number]   NDC: [phone]

## 2021-03-02 ENCOUNTER — Other Ambulatory Visit (HOSPITAL_BASED_OUTPATIENT_CLINIC_OR_DEPARTMENT_OTHER): Payer: Self-pay

## 2021-03-02 MED ORDER — SERTRALINE HCL 100 MG PO TABS
100.0000 mg | ORAL_TABLET | Freq: Every day | ORAL | 1 refills | Status: DC
  Filled 2021-05-18: qty 90, 90d supply, fill #0
  Filled 2021-08-31: qty 90, 90d supply, fill #1

## 2021-03-02 MED ORDER — LEVOTHYROXINE SODIUM 137 MCG PO TABS
ORAL_TABLET | ORAL | 5 refills | Status: DC
  Filled 2021-03-02: qty 30, 30d supply, fill #0
  Filled 2021-03-27: qty 30, 30d supply, fill #1
  Filled 2021-05-05: qty 90, 90d supply, fill #2

## 2021-03-02 MED ORDER — SIMVASTATIN 20 MG PO TABS
ORAL_TABLET | ORAL | 2 refills | Status: DC
  Filled 2021-03-02: qty 30, 30d supply, fill #0
  Filled 2021-03-27: qty 90, 90d supply, fill #0
  Filled 2021-06-21: qty 90, 90d supply, fill #1

## 2021-03-04 ENCOUNTER — Other Ambulatory Visit (HOSPITAL_BASED_OUTPATIENT_CLINIC_OR_DEPARTMENT_OTHER): Payer: Self-pay

## 2021-03-28 ENCOUNTER — Other Ambulatory Visit (HOSPITAL_BASED_OUTPATIENT_CLINIC_OR_DEPARTMENT_OTHER): Payer: Self-pay

## 2021-04-01 ENCOUNTER — Other Ambulatory Visit (HOSPITAL_BASED_OUTPATIENT_CLINIC_OR_DEPARTMENT_OTHER): Payer: Self-pay

## 2021-04-08 ENCOUNTER — Other Ambulatory Visit (HOSPITAL_BASED_OUTPATIENT_CLINIC_OR_DEPARTMENT_OTHER): Payer: Self-pay

## 2021-04-12 ENCOUNTER — Other Ambulatory Visit (HOSPITAL_BASED_OUTPATIENT_CLINIC_OR_DEPARTMENT_OTHER): Payer: Self-pay

## 2021-04-12 MED ORDER — TRIAMTERENE-HCTZ 37.5-25 MG PO TABS
ORAL_TABLET | ORAL | 3 refills | Status: DC
  Filled 2021-04-12: qty 30, 30d supply, fill #0
  Filled 2021-09-10: qty 30, 30d supply, fill #1
  Filled 2022-03-18: qty 30, 30d supply, fill #2

## 2021-05-05 ENCOUNTER — Other Ambulatory Visit (HOSPITAL_BASED_OUTPATIENT_CLINIC_OR_DEPARTMENT_OTHER): Payer: Self-pay

## 2021-05-17 ENCOUNTER — Other Ambulatory Visit (HOSPITAL_BASED_OUTPATIENT_CLINIC_OR_DEPARTMENT_OTHER): Payer: Self-pay

## 2021-05-17 MED ORDER — ESTRADIOL 0.1 MG/GM VA CREA
TOPICAL_CREAM | VAGINAL | 6 refills | Status: AC
  Filled 2021-05-17: qty 42.5, 90d supply, fill #0

## 2021-05-18 ENCOUNTER — Other Ambulatory Visit (HOSPITAL_BASED_OUTPATIENT_CLINIC_OR_DEPARTMENT_OTHER): Payer: Self-pay

## 2021-05-19 ENCOUNTER — Other Ambulatory Visit (HOSPITAL_BASED_OUTPATIENT_CLINIC_OR_DEPARTMENT_OTHER): Payer: Self-pay

## 2021-05-19 MED ORDER — CARESTART COVID-19 HOME TEST VI KIT
PACK | 0 refills | Status: AC
Start: 2021-05-19 — End: ?
  Filled 2021-05-19: qty 2, 4d supply, fill #0

## 2021-06-22 ENCOUNTER — Other Ambulatory Visit (HOSPITAL_BASED_OUTPATIENT_CLINIC_OR_DEPARTMENT_OTHER): Payer: Self-pay

## 2021-07-31 ENCOUNTER — Other Ambulatory Visit (HOSPITAL_BASED_OUTPATIENT_CLINIC_OR_DEPARTMENT_OTHER): Payer: Self-pay

## 2021-08-01 ENCOUNTER — Other Ambulatory Visit (HOSPITAL_BASED_OUTPATIENT_CLINIC_OR_DEPARTMENT_OTHER): Payer: Self-pay

## 2021-08-01 MED ORDER — LEVOTHYROXINE SODIUM 137 MCG PO TABS
ORAL_TABLET | ORAL | 11 refills | Status: DC
  Filled 2021-08-01: qty 90, 90d supply, fill #0
  Filled 2021-11-01: qty 90, 90d supply, fill #1
  Filled 2022-02-01: qty 84, 84d supply, fill #2
  Filled 2022-04-21: qty 84, 84d supply, fill #3
  Filled 2022-07-09: qty 84, 84d supply, fill #4

## 2021-08-31 ENCOUNTER — Other Ambulatory Visit (HOSPITAL_BASED_OUTPATIENT_CLINIC_OR_DEPARTMENT_OTHER): Payer: Self-pay

## 2021-09-01 ENCOUNTER — Other Ambulatory Visit (HOSPITAL_BASED_OUTPATIENT_CLINIC_OR_DEPARTMENT_OTHER): Payer: Self-pay

## 2021-09-01 MED ORDER — SIMVASTATIN 20 MG PO TABS
ORAL_TABLET | Freq: Every evening | ORAL | 0 refills | Status: DC
  Filled 2021-09-01 – 2021-09-05 (×3): qty 90, 90d supply, fill #0

## 2021-09-02 ENCOUNTER — Other Ambulatory Visit (HOSPITAL_BASED_OUTPATIENT_CLINIC_OR_DEPARTMENT_OTHER): Payer: Self-pay

## 2021-09-05 ENCOUNTER — Other Ambulatory Visit (HOSPITAL_BASED_OUTPATIENT_CLINIC_OR_DEPARTMENT_OTHER): Payer: Self-pay

## 2021-09-12 ENCOUNTER — Other Ambulatory Visit (HOSPITAL_BASED_OUTPATIENT_CLINIC_OR_DEPARTMENT_OTHER): Payer: Self-pay

## 2021-10-27 ENCOUNTER — Other Ambulatory Visit (HOSPITAL_BASED_OUTPATIENT_CLINIC_OR_DEPARTMENT_OTHER): Payer: Self-pay

## 2021-10-27 MED ORDER — SERTRALINE HCL 100 MG PO TABS
ORAL_TABLET | ORAL | 3 refills | Status: AC
  Filled 2021-10-27 – 2021-12-19 (×2): qty 90, 90d supply, fill #0
  Filled 2022-03-18: qty 90, 90d supply, fill #1
  Filled 2022-07-09: qty 90, 90d supply, fill #2

## 2021-10-27 MED ORDER — SIMVASTATIN 20 MG PO TABS
ORAL_TABLET | ORAL | 1 refills | Status: DC
  Filled 2021-10-27 – 2021-12-19 (×2): qty 90, 90d supply, fill #0
  Filled 2022-03-18: qty 90, 90d supply, fill #1

## 2021-11-01 ENCOUNTER — Other Ambulatory Visit (HOSPITAL_BASED_OUTPATIENT_CLINIC_OR_DEPARTMENT_OTHER): Payer: Self-pay

## 2021-12-19 ENCOUNTER — Other Ambulatory Visit (HOSPITAL_BASED_OUTPATIENT_CLINIC_OR_DEPARTMENT_OTHER): Payer: Self-pay

## 2022-02-01 ENCOUNTER — Other Ambulatory Visit (HOSPITAL_BASED_OUTPATIENT_CLINIC_OR_DEPARTMENT_OTHER): Payer: Self-pay

## 2022-03-20 ENCOUNTER — Other Ambulatory Visit (HOSPITAL_BASED_OUTPATIENT_CLINIC_OR_DEPARTMENT_OTHER): Payer: Self-pay

## 2022-04-21 ENCOUNTER — Other Ambulatory Visit (HOSPITAL_BASED_OUTPATIENT_CLINIC_OR_DEPARTMENT_OTHER): Payer: Self-pay

## 2022-04-24 ENCOUNTER — Other Ambulatory Visit (HOSPITAL_BASED_OUTPATIENT_CLINIC_OR_DEPARTMENT_OTHER): Payer: Self-pay

## 2022-05-16 ENCOUNTER — Other Ambulatory Visit (HOSPITAL_BASED_OUTPATIENT_CLINIC_OR_DEPARTMENT_OTHER): Payer: Self-pay

## 2022-05-16 MED ORDER — TRIAMTERENE-HCTZ 37.5-25 MG PO TABS: 1.0000 | ORAL_TABLET | Freq: Every day | ORAL | 3 refills | Status: AC | PRN

## 2022-05-16 MED ORDER — SERTRALINE HCL 100 MG PO TABS
100.0000 mg | ORAL_TABLET | Freq: Every day | ORAL | 3 refills | Status: DC
  Filled 2022-10-04: qty 90, 90d supply, fill #0
  Filled 2023-01-20: qty 90, 90d supply, fill #1
  Filled 2023-03-18 – 2023-04-20 (×3): qty 90, 90d supply, fill #2

## 2022-05-16 MED ORDER — SIMVASTATIN 20 MG PO TABS
20.0000 mg | ORAL_TABLET | Freq: Every evening | ORAL | 1 refills | Status: DC
  Filled 2022-06-20: qty 90, 90d supply, fill #0
  Filled 2022-09-19: qty 90, 90d supply, fill #1

## 2022-05-22 ENCOUNTER — Other Ambulatory Visit (HOSPITAL_BASED_OUTPATIENT_CLINIC_OR_DEPARTMENT_OTHER): Payer: Self-pay

## 2022-05-22 MED ORDER — AZITHROMYCIN 250 MG PO TABS
ORAL_TABLET | ORAL | 0 refills | Status: AC
  Filled 2022-05-22: qty 6, 5d supply, fill #0

## 2022-06-20 ENCOUNTER — Other Ambulatory Visit (HOSPITAL_BASED_OUTPATIENT_CLINIC_OR_DEPARTMENT_OTHER): Payer: Self-pay

## 2022-06-21 ENCOUNTER — Other Ambulatory Visit (HOSPITAL_BASED_OUTPATIENT_CLINIC_OR_DEPARTMENT_OTHER): Payer: Self-pay

## 2022-07-10 ENCOUNTER — Other Ambulatory Visit: Payer: Self-pay

## 2022-07-10 ENCOUNTER — Other Ambulatory Visit (HOSPITAL_BASED_OUTPATIENT_CLINIC_OR_DEPARTMENT_OTHER): Payer: Self-pay

## 2022-07-10 ENCOUNTER — Encounter (HOSPITAL_BASED_OUTPATIENT_CLINIC_OR_DEPARTMENT_OTHER): Payer: Self-pay | Admitting: Pharmacist

## 2022-07-10 MED ORDER — LEVOTHYROXINE SODIUM 137 MCG PO TABS
137.0000 ug | ORAL_TABLET | Freq: Every morning | ORAL | 11 refills | Status: DC
  Filled 2022-07-10: qty 90, 90d supply, fill #0
  Filled 2022-10-04: qty 90, 90d supply, fill #1
  Filled 2023-01-02: qty 90, 90d supply, fill #2
  Filled 2023-03-18: qty 90, 90d supply, fill #3
  Filled ????-??-??: fill #3

## 2022-08-15 DIAGNOSIS — Z1272 Encounter for screening for malignant neoplasm of vagina: Secondary | ICD-10-CM | POA: Diagnosis not present

## 2022-08-15 DIAGNOSIS — Z01419 Encounter for gynecological examination (general) (routine) without abnormal findings: Secondary | ICD-10-CM | POA: Diagnosis not present

## 2022-08-15 DIAGNOSIS — Z1231 Encounter for screening mammogram for malignant neoplasm of breast: Secondary | ICD-10-CM | POA: Diagnosis not present

## 2022-08-15 DIAGNOSIS — Z6827 Body mass index (BMI) 27.0-27.9, adult: Secondary | ICD-10-CM | POA: Diagnosis not present

## 2022-08-23 ENCOUNTER — Other Ambulatory Visit (HOSPITAL_BASED_OUTPATIENT_CLINIC_OR_DEPARTMENT_OTHER): Payer: Self-pay

## 2022-08-25 ENCOUNTER — Other Ambulatory Visit (HOSPITAL_BASED_OUTPATIENT_CLINIC_OR_DEPARTMENT_OTHER): Payer: Self-pay

## 2022-08-25 MED ORDER — TRIAMTERENE-HCTZ 37.5-25 MG PO TABS
1.0000 | ORAL_TABLET | Freq: Every morning | ORAL | 0 refills | Status: AC
  Filled 2022-08-25: qty 30, 30d supply, fill #0

## 2022-09-19 ENCOUNTER — Other Ambulatory Visit (HOSPITAL_BASED_OUTPATIENT_CLINIC_OR_DEPARTMENT_OTHER): Payer: Self-pay

## 2022-10-04 ENCOUNTER — Other Ambulatory Visit (HOSPITAL_BASED_OUTPATIENT_CLINIC_OR_DEPARTMENT_OTHER): Payer: Self-pay

## 2022-10-23 DIAGNOSIS — E785 Hyperlipidemia, unspecified: Secondary | ICD-10-CM | POA: Diagnosis not present

## 2022-10-23 DIAGNOSIS — E039 Hypothyroidism, unspecified: Secondary | ICD-10-CM | POA: Diagnosis not present

## 2022-10-23 DIAGNOSIS — H8109 Meniere's disease, unspecified ear: Secondary | ICD-10-CM | POA: Diagnosis not present

## 2022-10-23 DIAGNOSIS — R7301 Impaired fasting glucose: Secondary | ICD-10-CM | POA: Diagnosis not present

## 2022-11-15 ENCOUNTER — Other Ambulatory Visit (HOSPITAL_BASED_OUTPATIENT_CLINIC_OR_DEPARTMENT_OTHER): Payer: Self-pay

## 2022-11-15 DIAGNOSIS — Z0000 Encounter for general adult medical examination without abnormal findings: Secondary | ICD-10-CM | POA: Diagnosis not present

## 2022-11-15 DIAGNOSIS — F325 Major depressive disorder, single episode, in full remission: Secondary | ICD-10-CM | POA: Diagnosis not present

## 2022-11-15 DIAGNOSIS — E039 Hypothyroidism, unspecified: Secondary | ICD-10-CM | POA: Diagnosis not present

## 2022-11-15 DIAGNOSIS — M85852 Other specified disorders of bone density and structure, left thigh: Secondary | ICD-10-CM | POA: Diagnosis not present

## 2022-11-15 DIAGNOSIS — E7800 Pure hypercholesterolemia, unspecified: Secondary | ICD-10-CM | POA: Diagnosis not present

## 2022-11-15 DIAGNOSIS — H8109 Meniere's disease, unspecified ear: Secondary | ICD-10-CM | POA: Diagnosis not present

## 2022-11-15 DIAGNOSIS — Z23 Encounter for immunization: Secondary | ICD-10-CM | POA: Diagnosis not present

## 2022-11-15 MED ORDER — TRIAMTERENE-HCTZ 37.5-25 MG PO TABS
1.0000 | ORAL_TABLET | Freq: Every day | ORAL | 1 refills | Status: DC
  Filled 2022-11-15 – 2022-11-24 (×2): qty 90, 90d supply, fill #0
  Filled 2023-06-13: qty 90, 90d supply, fill #1

## 2022-11-15 MED ORDER — SIMVASTATIN 20 MG PO TABS
20.0000 mg | ORAL_TABLET | Freq: Every evening | ORAL | 1 refills | Status: DC
  Filled 2022-11-15 – 2022-12-13 (×2): qty 90, 90d supply, fill #0
  Filled 2023-03-18: qty 90, 90d supply, fill #1

## 2022-11-24 ENCOUNTER — Other Ambulatory Visit (HOSPITAL_BASED_OUTPATIENT_CLINIC_OR_DEPARTMENT_OTHER): Payer: Self-pay

## 2022-12-04 ENCOUNTER — Other Ambulatory Visit: Payer: Self-pay

## 2022-12-13 ENCOUNTER — Other Ambulatory Visit (HOSPITAL_BASED_OUTPATIENT_CLINIC_OR_DEPARTMENT_OTHER): Payer: Self-pay

## 2022-12-14 ENCOUNTER — Other Ambulatory Visit: Payer: Self-pay

## 2023-03-18 ENCOUNTER — Other Ambulatory Visit (HOSPITAL_BASED_OUTPATIENT_CLINIC_OR_DEPARTMENT_OTHER): Payer: Self-pay

## 2023-03-19 ENCOUNTER — Other Ambulatory Visit: Payer: Self-pay

## 2023-03-22 ENCOUNTER — Other Ambulatory Visit (HOSPITAL_BASED_OUTPATIENT_CLINIC_OR_DEPARTMENT_OTHER): Payer: Self-pay

## 2023-04-19 DIAGNOSIS — E039 Hypothyroidism, unspecified: Secondary | ICD-10-CM | POA: Diagnosis not present

## 2023-04-19 DIAGNOSIS — R7301 Impaired fasting glucose: Secondary | ICD-10-CM | POA: Diagnosis not present

## 2023-04-19 DIAGNOSIS — E785 Hyperlipidemia, unspecified: Secondary | ICD-10-CM | POA: Diagnosis not present

## 2023-05-11 ENCOUNTER — Other Ambulatory Visit (HOSPITAL_BASED_OUTPATIENT_CLINIC_OR_DEPARTMENT_OTHER): Payer: Self-pay

## 2023-05-11 MED ORDER — INFLUENZA VAC A&B SURF ANT ADJ 0.5 ML IM SUSY
0.5000 mL | PREFILLED_SYRINGE | Freq: Once | INTRAMUSCULAR | 0 refills | Status: AC
  Filled 2023-05-11: qty 0.5, 1d supply, fill #0

## 2023-05-17 ENCOUNTER — Other Ambulatory Visit (HOSPITAL_BASED_OUTPATIENT_CLINIC_OR_DEPARTMENT_OTHER): Payer: Self-pay

## 2023-05-17 DIAGNOSIS — E039 Hypothyroidism, unspecified: Secondary | ICD-10-CM | POA: Diagnosis not present

## 2023-05-17 DIAGNOSIS — Z79899 Other long term (current) drug therapy: Secondary | ICD-10-CM | POA: Diagnosis not present

## 2023-05-17 DIAGNOSIS — E7800 Pure hypercholesterolemia, unspecified: Secondary | ICD-10-CM | POA: Diagnosis not present

## 2023-05-17 DIAGNOSIS — F325 Major depressive disorder, single episode, in full remission: Secondary | ICD-10-CM | POA: Diagnosis not present

## 2023-05-17 DIAGNOSIS — Z23 Encounter for immunization: Secondary | ICD-10-CM | POA: Diagnosis not present

## 2023-05-17 DIAGNOSIS — H8109 Meniere's disease, unspecified ear: Secondary | ICD-10-CM | POA: Diagnosis not present

## 2023-05-17 MED ORDER — SERTRALINE HCL 50 MG PO TABS
50.0000 mg | ORAL_TABLET | Freq: Every day | ORAL | 1 refills | Status: DC
  Filled 2023-05-17: qty 90, 90d supply, fill #0

## 2023-05-28 ENCOUNTER — Other Ambulatory Visit (HOSPITAL_BASED_OUTPATIENT_CLINIC_OR_DEPARTMENT_OTHER): Payer: Self-pay

## 2023-06-07 ENCOUNTER — Other Ambulatory Visit (HOSPITAL_BASED_OUTPATIENT_CLINIC_OR_DEPARTMENT_OTHER): Payer: Self-pay

## 2023-06-07 DIAGNOSIS — H8109 Meniere's disease, unspecified ear: Secondary | ICD-10-CM | POA: Diagnosis not present

## 2023-06-07 DIAGNOSIS — E039 Hypothyroidism, unspecified: Secondary | ICD-10-CM | POA: Diagnosis not present

## 2023-06-07 DIAGNOSIS — R7301 Impaired fasting glucose: Secondary | ICD-10-CM | POA: Diagnosis not present

## 2023-06-07 DIAGNOSIS — E785 Hyperlipidemia, unspecified: Secondary | ICD-10-CM | POA: Diagnosis not present

## 2023-06-07 MED ORDER — SERTRALINE HCL 100 MG PO TABS
100.0000 mg | ORAL_TABLET | Freq: Every day | ORAL | 11 refills | Status: AC
  Filled 2023-06-07 – 2023-06-13 (×2): qty 30, 30d supply, fill #0

## 2023-06-13 ENCOUNTER — Other Ambulatory Visit: Payer: Self-pay

## 2023-06-13 ENCOUNTER — Other Ambulatory Visit (HOSPITAL_BASED_OUTPATIENT_CLINIC_OR_DEPARTMENT_OTHER): Payer: Self-pay

## 2023-06-14 ENCOUNTER — Other Ambulatory Visit (HOSPITAL_BASED_OUTPATIENT_CLINIC_OR_DEPARTMENT_OTHER): Payer: Self-pay

## 2023-06-14 MED ORDER — SIMVASTATIN 40 MG PO TABS
40.0000 mg | ORAL_TABLET | Freq: Every day | ORAL | 1 refills | Status: DC
  Filled 2023-06-14: qty 90, 90d supply, fill #0

## 2023-06-14 MED ORDER — SIMVASTATIN 20 MG PO TABS
20.0000 mg | ORAL_TABLET | Freq: Every evening | ORAL | 1 refills | Status: AC
  Filled 2023-06-14: qty 90, 90d supply, fill #0

## 2023-06-14 MED ORDER — SERTRALINE HCL 100 MG PO TABS
100.0000 mg | ORAL_TABLET | Freq: Every day | ORAL | 1 refills | Status: DC
  Filled 2023-06-14 – 2023-08-05 (×3): qty 90, 90d supply, fill #0
  Filled 2023-10-30: qty 90, 90d supply, fill #1

## 2023-06-15 ENCOUNTER — Other Ambulatory Visit (HOSPITAL_BASED_OUTPATIENT_CLINIC_OR_DEPARTMENT_OTHER): Payer: Self-pay

## 2023-06-21 DIAGNOSIS — L570 Actinic keratosis: Secondary | ICD-10-CM | POA: Diagnosis not present

## 2023-06-21 DIAGNOSIS — D225 Melanocytic nevi of trunk: Secondary | ICD-10-CM | POA: Diagnosis not present

## 2023-06-21 DIAGNOSIS — D2221 Melanocytic nevi of right ear and external auricular canal: Secondary | ICD-10-CM | POA: Diagnosis not present

## 2023-06-21 DIAGNOSIS — I8391 Asymptomatic varicose veins of right lower extremity: Secondary | ICD-10-CM | POA: Diagnosis not present

## 2023-06-21 DIAGNOSIS — L578 Other skin changes due to chronic exposure to nonionizing radiation: Secondary | ICD-10-CM | POA: Diagnosis not present

## 2023-06-21 DIAGNOSIS — L821 Other seborrheic keratosis: Secondary | ICD-10-CM | POA: Diagnosis not present

## 2023-07-06 ENCOUNTER — Other Ambulatory Visit (HOSPITAL_BASED_OUTPATIENT_CLINIC_OR_DEPARTMENT_OTHER): Payer: Self-pay

## 2023-07-06 MED ORDER — LEVOTHYROXINE SODIUM 137 MCG PO TABS
137.0000 ug | ORAL_TABLET | Freq: Every day | ORAL | 11 refills | Status: DC
  Filled 2023-07-06: qty 30, 30d supply, fill #0
  Filled 2023-08-05: qty 90, 90d supply, fill #1
  Filled 2023-11-08: qty 90, 90d supply, fill #2
  Filled 2024-02-08: qty 90, 90d supply, fill #3
  Filled 2024-05-08: qty 60, 60d supply, fill #4

## 2023-08-02 ENCOUNTER — Other Ambulatory Visit (HOSPITAL_BASED_OUTPATIENT_CLINIC_OR_DEPARTMENT_OTHER): Payer: Self-pay

## 2023-08-02 DIAGNOSIS — J0100 Acute maxillary sinusitis, unspecified: Secondary | ICD-10-CM | POA: Diagnosis not present

## 2023-08-02 MED ORDER — AZITHROMYCIN 250 MG PO TABS
ORAL_TABLET | ORAL | 0 refills | Status: AC
  Filled 2023-08-02: qty 6, 5d supply, fill #0

## 2023-08-06 ENCOUNTER — Other Ambulatory Visit: Payer: Self-pay

## 2023-08-06 ENCOUNTER — Other Ambulatory Visit (HOSPITAL_BASED_OUTPATIENT_CLINIC_OR_DEPARTMENT_OTHER): Payer: Self-pay

## 2023-08-29 DIAGNOSIS — B349 Viral infection, unspecified: Secondary | ICD-10-CM | POA: Diagnosis not present

## 2023-08-29 DIAGNOSIS — E7800 Pure hypercholesterolemia, unspecified: Secondary | ICD-10-CM | POA: Diagnosis not present

## 2023-08-29 DIAGNOSIS — Z03818 Encounter for observation for suspected exposure to other biological agents ruled out: Secondary | ICD-10-CM | POA: Diagnosis not present

## 2023-09-04 ENCOUNTER — Other Ambulatory Visit (HOSPITAL_BASED_OUTPATIENT_CLINIC_OR_DEPARTMENT_OTHER): Payer: Self-pay

## 2023-09-04 MED ORDER — ROSUVASTATIN CALCIUM 20 MG PO TABS
20.0000 mg | ORAL_TABLET | Freq: Every day | ORAL | 3 refills | Status: DC
  Filled 2023-09-04: qty 30, 30d supply, fill #0
  Filled 2023-09-30: qty 30, 30d supply, fill #1
  Filled 2023-11-01: qty 30, 30d supply, fill #2
  Filled 2023-11-27: qty 30, 30d supply, fill #3

## 2023-09-05 ENCOUNTER — Other Ambulatory Visit (HOSPITAL_BASED_OUTPATIENT_CLINIC_OR_DEPARTMENT_OTHER): Payer: Self-pay

## 2023-09-05 DIAGNOSIS — Z1272 Encounter for screening for malignant neoplasm of vagina: Secondary | ICD-10-CM | POA: Diagnosis not present

## 2023-09-05 DIAGNOSIS — Z1231 Encounter for screening mammogram for malignant neoplasm of breast: Secondary | ICD-10-CM | POA: Diagnosis not present

## 2023-09-05 DIAGNOSIS — Z6827 Body mass index (BMI) 27.0-27.9, adult: Secondary | ICD-10-CM | POA: Diagnosis not present

## 2023-09-05 DIAGNOSIS — Z13820 Encounter for screening for osteoporosis: Secondary | ICD-10-CM | POA: Diagnosis not present

## 2023-09-05 DIAGNOSIS — Z01419 Encounter for gynecological examination (general) (routine) without abnormal findings: Secondary | ICD-10-CM | POA: Diagnosis not present

## 2023-09-05 MED ORDER — ESTRADIOL 0.1 MG/GM VA CREA
0.5000 g | TOPICAL_CREAM | VAGINAL | 6 refills | Status: AC
  Filled 2023-09-05: qty 42.5, 90d supply, fill #0

## 2023-09-06 ENCOUNTER — Other Ambulatory Visit (HOSPITAL_BASED_OUTPATIENT_CLINIC_OR_DEPARTMENT_OTHER): Payer: Self-pay

## 2023-09-06 MED ORDER — TRIAMTERENE-HCTZ 37.5-25 MG PO TABS
1.0000 | ORAL_TABLET | Freq: Every day | ORAL | 0 refills | Status: DC
  Filled 2023-09-06 – 2023-12-17 (×2): qty 90, 90d supply, fill #0

## 2023-09-17 ENCOUNTER — Other Ambulatory Visit (HOSPITAL_BASED_OUTPATIENT_CLINIC_OR_DEPARTMENT_OTHER): Payer: Self-pay

## 2023-10-24 DIAGNOSIS — E039 Hypothyroidism, unspecified: Secondary | ICD-10-CM | POA: Diagnosis not present

## 2023-10-24 DIAGNOSIS — R7301 Impaired fasting glucose: Secondary | ICD-10-CM | POA: Diagnosis not present

## 2023-10-31 ENCOUNTER — Other Ambulatory Visit (HOSPITAL_COMMUNITY): Payer: Self-pay | Admitting: Endocrinology

## 2023-10-31 ENCOUNTER — Other Ambulatory Visit (HOSPITAL_BASED_OUTPATIENT_CLINIC_OR_DEPARTMENT_OTHER): Payer: Self-pay

## 2023-10-31 DIAGNOSIS — R7301 Impaired fasting glucose: Secondary | ICD-10-CM | POA: Diagnosis not present

## 2023-10-31 DIAGNOSIS — E039 Hypothyroidism, unspecified: Secondary | ICD-10-CM | POA: Diagnosis not present

## 2023-10-31 DIAGNOSIS — E785 Hyperlipidemia, unspecified: Secondary | ICD-10-CM | POA: Diagnosis not present

## 2023-10-31 DIAGNOSIS — H8109 Meniere's disease, unspecified ear: Secondary | ICD-10-CM | POA: Diagnosis not present

## 2023-11-07 ENCOUNTER — Ambulatory Visit (HOSPITAL_COMMUNITY)
Admission: RE | Admit: 2023-11-07 | Discharge: 2023-11-07 | Disposition: A | Discharge status: Home / Self Care | Payer: Self-pay | Source: Ambulatory Visit | Attending: Endocrinology | Admitting: Endocrinology

## 2023-11-07 DIAGNOSIS — E785 Hyperlipidemia, unspecified: Secondary | ICD-10-CM | POA: Insufficient documentation

## 2023-12-12 DIAGNOSIS — Z0000 Encounter for general adult medical examination without abnormal findings: Secondary | ICD-10-CM | POA: Diagnosis not present

## 2023-12-12 DIAGNOSIS — H8109 Meniere's disease, unspecified ear: Secondary | ICD-10-CM | POA: Diagnosis not present

## 2023-12-12 DIAGNOSIS — E039 Hypothyroidism, unspecified: Secondary | ICD-10-CM | POA: Diagnosis not present

## 2023-12-12 DIAGNOSIS — E7800 Pure hypercholesterolemia, unspecified: Secondary | ICD-10-CM | POA: Diagnosis not present

## 2023-12-12 DIAGNOSIS — F325 Major depressive disorder, single episode, in full remission: Secondary | ICD-10-CM | POA: Diagnosis not present

## 2023-12-17 ENCOUNTER — Other Ambulatory Visit (HOSPITAL_BASED_OUTPATIENT_CLINIC_OR_DEPARTMENT_OTHER): Payer: Self-pay

## 2023-12-19 ENCOUNTER — Other Ambulatory Visit (HOSPITAL_BASED_OUTPATIENT_CLINIC_OR_DEPARTMENT_OTHER): Payer: Self-pay

## 2023-12-19 MED ORDER — ROSUVASTATIN CALCIUM 20 MG PO TABS
20.0000 mg | ORAL_TABLET | Freq: Every day | ORAL | 11 refills | Status: AC
  Filled 2023-12-19 – 2023-12-25 (×4): qty 30, 30d supply, fill #0
  Filled 2024-01-24: qty 30, 30d supply, fill #1
  Filled 2024-02-22: qty 90, 90d supply, fill #2
  Filled 2024-05-20: qty 90, 90d supply, fill #3
  Filled 2024-08-18: qty 90, 90d supply, fill #4

## 2023-12-26 ENCOUNTER — Other Ambulatory Visit (HOSPITAL_BASED_OUTPATIENT_CLINIC_OR_DEPARTMENT_OTHER): Payer: Self-pay

## 2024-01-24 ENCOUNTER — Other Ambulatory Visit (HOSPITAL_BASED_OUTPATIENT_CLINIC_OR_DEPARTMENT_OTHER): Payer: Self-pay

## 2024-01-25 ENCOUNTER — Other Ambulatory Visit (HOSPITAL_BASED_OUTPATIENT_CLINIC_OR_DEPARTMENT_OTHER): Payer: Self-pay

## 2024-01-25 MED ORDER — SERTRALINE HCL 100 MG PO TABS
100.0000 mg | ORAL_TABLET | Freq: Every day | ORAL | 1 refills | Status: DC
  Filled 2024-01-25: qty 90, 90d supply, fill #0
  Filled 2024-02-22 – 2024-04-21 (×3): qty 90, 90d supply, fill #1

## 2024-02-08 ENCOUNTER — Other Ambulatory Visit (HOSPITAL_BASED_OUTPATIENT_CLINIC_OR_DEPARTMENT_OTHER): Payer: Self-pay

## 2024-02-22 ENCOUNTER — Other Ambulatory Visit: Payer: Self-pay

## 2024-02-22 ENCOUNTER — Other Ambulatory Visit (HOSPITAL_BASED_OUTPATIENT_CLINIC_OR_DEPARTMENT_OTHER): Payer: Self-pay

## 2024-02-25 ENCOUNTER — Other Ambulatory Visit (HOSPITAL_BASED_OUTPATIENT_CLINIC_OR_DEPARTMENT_OTHER): Payer: Self-pay

## 2024-03-28 ENCOUNTER — Encounter (INDEPENDENT_AMBULATORY_CARE_PROVIDER_SITE_OTHER): Payer: Self-pay

## 2024-04-07 ENCOUNTER — Other Ambulatory Visit (HOSPITAL_BASED_OUTPATIENT_CLINIC_OR_DEPARTMENT_OTHER): Payer: Self-pay

## 2024-04-07 MED ORDER — FLUZONE HIGH-DOSE 0.5 ML IM SUSY
0.5000 mL | PREFILLED_SYRINGE | Freq: Once | INTRAMUSCULAR | 0 refills | Status: AC
Start: 2024-04-07 — End: 2024-04-08
  Filled 2024-04-07: qty 0.5, 1d supply, fill #0

## 2024-04-22 ENCOUNTER — Other Ambulatory Visit (HOSPITAL_BASED_OUTPATIENT_CLINIC_OR_DEPARTMENT_OTHER): Payer: Self-pay

## 2024-04-22 MED ORDER — TRIAMTERENE-HCTZ 37.5-25 MG PO TABS
1.0000 | ORAL_TABLET | Freq: Every day | ORAL | 0 refills | Status: DC
  Filled 2024-04-22: qty 90, 90d supply, fill #0

## 2024-04-23 ENCOUNTER — Other Ambulatory Visit (HOSPITAL_BASED_OUTPATIENT_CLINIC_OR_DEPARTMENT_OTHER): Payer: Self-pay

## 2024-04-23 MED ORDER — COMIRNATY 30 MCG/0.3ML IM SUSY
0.3000 mL | PREFILLED_SYRINGE | Freq: Once | INTRAMUSCULAR | 0 refills | Status: AC
  Filled 2024-04-23: qty 0.3, 1d supply, fill #0

## 2024-05-01 DIAGNOSIS — R7301 Impaired fasting glucose: Secondary | ICD-10-CM | POA: Diagnosis not present

## 2024-05-04 ENCOUNTER — Other Ambulatory Visit (HOSPITAL_BASED_OUTPATIENT_CLINIC_OR_DEPARTMENT_OTHER): Payer: Self-pay

## 2024-05-08 DIAGNOSIS — E785 Hyperlipidemia, unspecified: Secondary | ICD-10-CM | POA: Diagnosis not present

## 2024-05-08 DIAGNOSIS — H8109 Meniere's disease, unspecified ear: Secondary | ICD-10-CM | POA: Diagnosis not present

## 2024-05-08 DIAGNOSIS — E039 Hypothyroidism, unspecified: Secondary | ICD-10-CM | POA: Diagnosis not present

## 2024-05-08 DIAGNOSIS — M8580 Other specified disorders of bone density and structure, unspecified site: Secondary | ICD-10-CM | POA: Diagnosis not present

## 2024-05-08 DIAGNOSIS — R7301 Impaired fasting glucose: Secondary | ICD-10-CM | POA: Diagnosis not present

## 2024-05-08 DIAGNOSIS — F419 Anxiety disorder, unspecified: Secondary | ICD-10-CM | POA: Diagnosis not present

## 2024-05-09 ENCOUNTER — Other Ambulatory Visit (HOSPITAL_BASED_OUTPATIENT_CLINIC_OR_DEPARTMENT_OTHER): Payer: Self-pay

## 2024-05-21 ENCOUNTER — Other Ambulatory Visit (HOSPITAL_BASED_OUTPATIENT_CLINIC_OR_DEPARTMENT_OTHER): Payer: Self-pay

## 2024-05-29 ENCOUNTER — Ambulatory Visit (INDEPENDENT_AMBULATORY_CARE_PROVIDER_SITE_OTHER): Payer: Self-pay | Admitting: Otolaryngology

## 2024-05-29 ENCOUNTER — Encounter (INDEPENDENT_AMBULATORY_CARE_PROVIDER_SITE_OTHER): Payer: Self-pay | Admitting: Otolaryngology

## 2024-05-29 VITALS — HR 68 | Temp 98.4°F | Ht 67.5 in | Wt 172.0 lb

## 2024-05-29 DIAGNOSIS — R42 Dizziness and giddiness: Secondary | ICD-10-CM | POA: Insufficient documentation

## 2024-05-29 DIAGNOSIS — H8102 Meniere's disease, left ear: Secondary | ICD-10-CM

## 2024-05-29 DIAGNOSIS — H9312 Tinnitus, left ear: Secondary | ICD-10-CM | POA: Insufficient documentation

## 2024-05-29 DIAGNOSIS — H9042 Sensorineural hearing loss, unilateral, left ear, with unrestricted hearing on the contralateral side: Secondary | ICD-10-CM | POA: Diagnosis not present

## 2024-05-29 NOTE — Progress Notes (Signed)
 CC: Recurrent dizziness, left ear hearing loss, left ear tinnitus, left ear Mnire's disease  Discussed the use of AI scribe software for clinical note transcription with the patient, who gave verbal consent to proceed.  History of Present Illness Sara Santiago is a 68 year old female with Meniere's disease who presents with a recent episode of dizziness.  She has a history of Meniere's disease affecting her left ear, for which she underwent a chemical labyrinthotomy with gentamicin infusion several years ago. Since the procedure, she has experienced significant improvement with only one mild episode of dizziness occurring about a month ago. This episode involved a sensation of spinning that lasted for a few hours in the morning, causing anxiety about the potential return of her symptoms.  She continues to experience asymmetric hearing loss and tinnitus in her left ear. She uses a CROS hearing aid to manage her hearing loss, although her current hearing aid was damaged by her cat, and she is seeking assistance from an audiologist for repair.  Her current medication regimen includes Dyazide  daily. She adheres to a low salt diet as part of her management plan. No significant changes in her hearing since her last evaluation.  Currently she denies any otalgia, otorrhea, or vertigo.   Past Medical History:  Diagnosis Date   Arthritis    Complication of anesthesia    Depression    Hypothyroidism    Meniere's disease of left ear    uses Triamterene -Hydrochlorothizide for this   PONV (postoperative nausea and vomiting)     Past Surgical History:  Procedure Laterality Date   ABDOMINAL HYSTERECTOMY     cervical fusion      C5-6, caused pain post-op so now on Tegretol    LASER ABLATION     on retina-right eye   TONSILLECTOMY     as child   TOTAL HIP ARTHROPLASTY Right 04/02/2015   Procedure: RIGHT TOTAL HIP ARTHROPLASTY ANTERIOR APPROACH;  Surgeon: Lonni CINDERELLA Poli, MD;  Location: [facility];  Service: Orthopedics;  Laterality: Right;    History reviewed. No pertinent family history.  Social History:  reports that she has never smoked. She has never used smokeless tobacco. She reports current alcohol use. She reports that she does not use drugs.  Allergies: No Known Allergies  Prior to Admission medications   Medication Sig Start Date End Date Taking? Authorizing Provider  aspirin  EC 325 MG EC tablet Take 1 tablet (325 mg total) by mouth 2 (two) times daily after a meal. 04/05/15  Yes Poli Lonni CINDERELLA, MD  atorvastatin (LIPITOR) 20 MG tablet Take by mouth.   Yes [provider]  azithromycin  (ZITHROMAX ) 250 MG tablet two tabs day # 1 and one each day thereafter Orally as directed 5 days 08/02/23  Yes   CALCIUM  PO Take 1 each by mouth every morning.   Yes [provider]  carbamazepine  (TEGRETOL ) 200 MG tablet Take 1 tablet by mouth 2 (two) times daily. 03/04/15  Yes [provider]  COVID-19 At Home Antigen Test [facility] COVID-19 HOME TEST) KIT As directed 05/19/21  Yes [place], [place], RPH  COVID-19 mRNA vaccine, Pfizer, 30 MCG/0.3ML injection Inject into the muscle. 11/15/20  Yes Luiz Channel, MD  estradiol  (ESTRACE ) 0.1 MG/GM vaginal cream Insert 0.5 g twice a week by vaginal route at bedtime. 05/17/21  Yes   estradiol  (ESTRACE ) 0.1 MG/GM vaginal cream Place 0.5 g vaginally 2 (two) times a week at bedtime. 09/06/23  Yes   levothyroxine  (SYNTHROID ) 137 MCG  tablet Take 1 tablet (137 mcg total) by mouth daily on an empty stomach. 07/06/23  Yes   levothyroxine  (SYNTHROID , LEVOTHROID) 137 MCG tablet TK 1 T PO QD 02/16/18  Yes [provider]  Multiple Vitamins-Minerals (MULTI ADULT GUMMIES PO) Take 1 each by mouth every morning.   Yes [provider]  phentermine (ADIPEX-P) 37.5 MG tablet TK 1 T PO QD 03/04/18  Yes [provider]  rosuvastatin  (CRESTOR ) 20 MG tablet Take 1 tablet (20 mg total) by mouth daily. 12/18/23  Yes    sertraline  (ZOLOFT ) 100 MG tablet Take 100 mg by mouth at bedtime.    Yes [provider]  sertraline  (ZOLOFT ) 100 MG tablet take 1 tablet by mouth every day Orally Once a day 90 days 10/27/21  Yes   sertraline  (ZOLOFT ) 100 MG tablet Take 1 tablet (100 mg total) by mouth daily. 06/07/23  Yes   sertraline  (ZOLOFT ) 100 MG tablet Take 1 tablet (100 mg total) by mouth daily. 01/25/24  Yes   simvastatin  (ZOCOR ) 20 MG tablet Take 20 mg by mouth at bedtime.   Yes [provider]  simvastatin  (ZOCOR ) 20 MG tablet Take 1 tablet (20 mg total) by mouth every evening. 06/14/23  Yes   triamterene -hydrochlorothiazide  (DYAZIDE ) 37.5-25 MG per capsule Take 1 capsule by mouth every morning.   Yes [provider]  triamterene -hydrochlorothiazide  (MAXZIDE -25) 37.5-25 MG tablet Take 1 tablet by mouth daily as needed for meniere's. 05/16/22  Yes   triamterene -hydrochlorothiazide  (MAXZIDE -25) 37.5-25 MG tablet Take 1 tablet by mouth in the morning as needed for Meniere's. 08/24/22  Yes   triamterene -hydrochlorothiazide  (MAXZIDE -25) 37.5-25 MG tablet Take 1 tablet by mouth daily. 04/22/24  Yes   azithromycin  (ZITHROMAX ) 250 MG tablet Take 2 tablets by mouth on the first day, then 1 tablet daily for 4 days. 05/22/22       Pulse 68, temperature 98.4 F (36.9 C), temperature source Oral, height 5' 7.5 (1.715 m), weight 172 lb (78 kg), SpO2 97%. Exam: General: Communicates without difficulty, well nourished, no acute distress. Head: Normocephalic, no evidence injury, no tenderness, facial buttresses intact without stepoff. Face/sinus: No tenderness to palpation and percussion. Facial movement is normal and symmetric. Eyes: PERRL, EOMI. No scleral icterus, conjunctivae clear. Neuro: CN II exam reveals vision grossly intact.  No nystagmus at any point of gaze. Ears: Auricles well formed without lesions.  Ear canals are intact without mass or lesion.  No erythema or edema is appreciated.  The TMs are  intact without fluid. Nose: External evaluation reveals normal support and skin without lesions.  Dorsum is intact.  Anterior rhinoscopy reveals congested mucosa over anterior aspect of inferior turbinates and intact septum.  No purulence noted. Oral:  Oral cavity and oropharynx are intact, symmetric, without erythema or edema.  Mucosa is moist without lesions. Neck: Full range of motion without pain.  There is no significant lymphadenopathy.  No masses palpable.  Thyroid  bed within normal limits to palpation.  Parotid glands and submandibular glands equal bilaterally without mass.  Trachea is midline. Neuro:  CN 2-12 grossly intact. Vestibular: No nystagmus at any point of gaze. Vestibular: There is no nystagmus with pneumatic pressure on either tympanic membrane or Valsalva. The cerebellar examination is unremarkable.   Assessment and Plan Assessment & Plan Left Meniere's disease status post chemical labyrinthotomy Left Meniere's disease treated with chemical labyrinthotomy using gentamicin, effectively reducing vertigo episodes. Only one mild dizziness episode in the past three years, lasting a few hours, with no recurrence  since.  -The pathophysiology of Mnire's disease and dizziness are discussed with the patient. - Continue Dyazide  daily - Continue a 1500 mg low salt diet. - Schedule annual follow-up appointments - Instruct to report any recurrence of symptoms  Left sensorineural hearing loss Left sensorineural hearing loss secondary to her Mnire's disease and chemical labyrinthotomy with gentamicin, affecting the auditory nerve. Hearing loss is managed with a CROS hearing aid. - Refer to audiologist for hearing aid evaluation and repair  Left ear tinnitus Persistent left ear tinnitus associated with sensorineural hearing loss. No change in tinnitus symptoms reported. - The strategies to cope with tinnitus, including the use of masker, hearing aids, tinnitus retraining therapy, and  avoidance of caffeine and alcohol are discussed.   Brogan England W Alvy Alsop 05/29/2024, 2:21 PM

## 2024-06-02 NOTE — Progress Notes (Unsigned)
  [address], [address] [place], [place] [address] [phone]  Hearing Aid Check     Sara Santiago comes for a scheduled appointment for a hearing aid check.  Time in:*** Time out:*** Accompanied by:***   Right Left  Hearing aid manufacturer    Hearing aid style    Hearing aid battery    Receiver    Dome/ custom earpiece    Retention wire    Warranty expiration date    Loss and Damage    Additional accessories Expiration date    Initial fitting date    Device was fit at:    Hearing aid services Bundled until:  Bundled until:     Chief complaint: Patient reports ***.  Actions taken: Inspection of the device and listening check showed that ***.  Services fee: $*** was paid at checkout.  Patient was oriented about ***.  Recommend: Return for a hearing aid check ***. Return for a hearing evaluation and to see an ENT, if concerns with hearing changes arise. ***  Starr Urias MARIE LEROUX-MARTINEZ, AUD

## 2024-06-03 DIAGNOSIS — H33002 Unspecified retinal detachment with retinal break, left eye: Secondary | ICD-10-CM | POA: Diagnosis not present

## 2024-06-04 ENCOUNTER — Ambulatory Visit (INDEPENDENT_AMBULATORY_CARE_PROVIDER_SITE_OTHER): Payer: Self-pay | Admitting: Audiology

## 2024-06-04 DIAGNOSIS — H90A22 Sensorineural hearing loss, unilateral, left ear, with restricted hearing on the contralateral side: Secondary | ICD-10-CM

## 2024-06-17 ENCOUNTER — Other Ambulatory Visit (HOSPITAL_BASED_OUTPATIENT_CLINIC_OR_DEPARTMENT_OTHER): Payer: Self-pay

## 2024-06-17 DIAGNOSIS — F325 Major depressive disorder, single episode, in full remission: Secondary | ICD-10-CM | POA: Diagnosis not present

## 2024-06-17 DIAGNOSIS — H8109 Meniere's disease, unspecified ear: Secondary | ICD-10-CM | POA: Diagnosis not present

## 2024-06-17 DIAGNOSIS — E7800 Pure hypercholesterolemia, unspecified: Secondary | ICD-10-CM | POA: Diagnosis not present

## 2024-06-17 DIAGNOSIS — E039 Hypothyroidism, unspecified: Secondary | ICD-10-CM | POA: Diagnosis not present

## 2024-06-17 DIAGNOSIS — L989 Disorder of the skin and subcutaneous tissue, unspecified: Secondary | ICD-10-CM | POA: Diagnosis not present

## 2024-06-17 MED ORDER — SERTRALINE HCL 100 MG PO TABS
100.0000 mg | ORAL_TABLET | Freq: Every day | ORAL | 1 refills | Status: AC
  Filled 2024-06-17 – 2024-07-19 (×2): qty 90, 90d supply, fill #0

## 2024-06-17 MED ORDER — TRIAMTERENE-HCTZ 37.5-25 MG PO TABS
1.0000 | ORAL_TABLET | Freq: Every day | ORAL | 1 refills | Status: AC
  Filled 2024-06-17 – 2024-08-04 (×2): qty 90, 90d supply, fill #0

## 2024-07-06 ENCOUNTER — Other Ambulatory Visit (HOSPITAL_BASED_OUTPATIENT_CLINIC_OR_DEPARTMENT_OTHER): Payer: Self-pay

## 2024-07-07 ENCOUNTER — Other Ambulatory Visit (HOSPITAL_BASED_OUTPATIENT_CLINIC_OR_DEPARTMENT_OTHER): Payer: Self-pay

## 2024-07-07 MED ORDER — LEVOTHYROXINE SODIUM 137 MCG PO TABS
137.0000 ug | ORAL_TABLET | Freq: Every day | ORAL | 11 refills | Status: AC
  Filled 2024-07-07: qty 30, 30d supply, fill #0
  Filled 2024-08-04 (×2): qty 90, 90d supply, fill #1
  Filled 2024-08-18: qty 90, 90d supply, fill #2

## 2024-07-21 ENCOUNTER — Other Ambulatory Visit (HOSPITAL_BASED_OUTPATIENT_CLINIC_OR_DEPARTMENT_OTHER): Payer: Self-pay

## 2024-07-21 ENCOUNTER — Other Ambulatory Visit: Payer: Self-pay

## 2024-08-04 ENCOUNTER — Other Ambulatory Visit: Payer: Self-pay

## 2024-08-04 ENCOUNTER — Other Ambulatory Visit (HOSPITAL_BASED_OUTPATIENT_CLINIC_OR_DEPARTMENT_OTHER): Payer: Self-pay

## 2024-08-18 ENCOUNTER — Other Ambulatory Visit (HOSPITAL_BASED_OUTPATIENT_CLINIC_OR_DEPARTMENT_OTHER): Payer: Self-pay

## 2024-08-20 ENCOUNTER — Other Ambulatory Visit (HOSPITAL_BASED_OUTPATIENT_CLINIC_OR_DEPARTMENT_OTHER): Payer: Self-pay
